# Patient Record
Sex: Female | Born: 1935
Health system: Southern US, Community
[De-identification: ages and names within clinical notes are randomized; demographics above are authoritative.]

## PROBLEM LIST (undated history)

## (undated) DIAGNOSIS — I341 Nonrheumatic mitral (valve) prolapse: Secondary | ICD-10-CM

## (undated) DIAGNOSIS — R600 Localized edema: Secondary | ICD-10-CM

## (undated) DIAGNOSIS — I10 Essential (primary) hypertension: Secondary | ICD-10-CM

## (undated) DIAGNOSIS — G43909 Migraine, unspecified, not intractable, without status migrainosus: Secondary | ICD-10-CM

## (undated) DIAGNOSIS — E785 Hyperlipidemia, unspecified: Secondary | ICD-10-CM

## (undated) DIAGNOSIS — I4819 Other persistent atrial fibrillation: Secondary | ICD-10-CM

## (undated) DIAGNOSIS — I340 Nonrheumatic mitral (valve) insufficiency: Secondary | ICD-10-CM

## (undated) DIAGNOSIS — Z952 Presence of prosthetic heart valve: Secondary | ICD-10-CM

## (undated) DIAGNOSIS — M8580 Other specified disorders of bone density and structure, unspecified site: Secondary | ICD-10-CM

## (undated) HISTORY — DX: Presence of prosthetic heart valve: Z95.2

## (undated) HISTORY — DX: Nonrheumatic mitral (valve) prolapse: I34.1

## (undated) HISTORY — PX: BLADDER SUSPENSION: SHX72

## (undated) HISTORY — DX: Nonrheumatic mitral (valve) insufficiency: I34.0

## (undated) HISTORY — DX: Other persistent atrial fibrillation: I48.19

## (undated) HISTORY — DX: Hyperlipidemia, unspecified: E78.5

## (undated) HISTORY — DX: Localized edema: R60.0

## (undated) HISTORY — DX: Migraine, unspecified, not intractable, without status migrainosus: G43.909

## (undated) HISTORY — PX: MITRAL VALVE REPLACEMENT: SHX147

## (undated) HISTORY — DX: Essential (primary) hypertension: I10

## (undated) HISTORY — DX: Other specified disorders of bone density and structure, unspecified site: M85.80

---

## 1976-02-01 HISTORY — PX: ABDOMINAL HYSTERECTOMY: SHX81

## 2005-02-22 ENCOUNTER — Inpatient Hospital Stay (HOSPITAL_COMMUNITY): Admission: RE | Admit: 2005-02-22 | Discharge: 2005-02-25 | Payer: Self-pay | Admitting: Urology

## 2005-02-23 ENCOUNTER — Ambulatory Visit: Payer: Self-pay | Admitting: Pulmonary Disease

## 2008-06-23 ENCOUNTER — Ambulatory Visit (HOSPITAL_COMMUNITY): Admission: RE | Admit: 2008-06-23 | Discharge: 2008-06-23 | Payer: Self-pay | Admitting: Interventional Cardiology

## 2008-06-23 ENCOUNTER — Encounter (INDEPENDENT_AMBULATORY_CARE_PROVIDER_SITE_OTHER): Payer: Self-pay | Admitting: Interventional Cardiology

## 2008-07-07 ENCOUNTER — Ambulatory Visit: Payer: Self-pay | Admitting: Thoracic Surgery (Cardiothoracic Vascular Surgery)

## 2008-07-30 ENCOUNTER — Inpatient Hospital Stay (HOSPITAL_BASED_OUTPATIENT_CLINIC_OR_DEPARTMENT_OTHER): Admission: RE | Admit: 2008-07-30 | Discharge: 2008-07-30 | Payer: Self-pay | Admitting: Interventional Cardiology

## 2008-08-11 ENCOUNTER — Ambulatory Visit: Payer: Self-pay | Admitting: Thoracic Surgery (Cardiothoracic Vascular Surgery)

## 2008-09-22 ENCOUNTER — Ambulatory Visit (HOSPITAL_COMMUNITY)
Admission: RE | Admit: 2008-09-22 | Discharge: 2008-09-22 | Payer: Self-pay | Admitting: Thoracic Surgery (Cardiothoracic Vascular Surgery)

## 2008-09-22 ENCOUNTER — Ambulatory Visit: Payer: Self-pay | Admitting: Thoracic Surgery (Cardiothoracic Vascular Surgery)

## 2008-09-22 ENCOUNTER — Encounter: Payer: Self-pay | Admitting: Thoracic Surgery (Cardiothoracic Vascular Surgery)

## 2008-09-24 ENCOUNTER — Ambulatory Visit: Payer: Self-pay | Admitting: Thoracic Surgery (Cardiothoracic Vascular Surgery)

## 2008-09-24 ENCOUNTER — Inpatient Hospital Stay (HOSPITAL_COMMUNITY)
Admission: RE | Admit: 2008-09-24 | Discharge: 2008-10-01 | Payer: Self-pay | Admitting: Thoracic Surgery (Cardiothoracic Vascular Surgery)

## 2008-09-24 ENCOUNTER — Encounter: Payer: Self-pay | Admitting: Thoracic Surgery (Cardiothoracic Vascular Surgery)

## 2008-09-24 HISTORY — PX: COX-MAZE MICROWAVE ABLATION: SHX1404

## 2008-10-07 ENCOUNTER — Ambulatory Visit: Payer: Self-pay | Admitting: Thoracic Surgery (Cardiothoracic Vascular Surgery)

## 2008-10-07 ENCOUNTER — Encounter
Admission: RE | Admit: 2008-10-07 | Discharge: 2008-10-07 | Payer: Self-pay | Admitting: Thoracic Surgery (Cardiothoracic Vascular Surgery)

## 2008-11-03 ENCOUNTER — Encounter
Admission: RE | Admit: 2008-11-03 | Discharge: 2008-11-03 | Payer: Self-pay | Admitting: Thoracic Surgery (Cardiothoracic Vascular Surgery)

## 2008-11-03 ENCOUNTER — Ambulatory Visit: Payer: Self-pay | Admitting: Thoracic Surgery (Cardiothoracic Vascular Surgery)

## 2009-01-12 ENCOUNTER — Ambulatory Visit: Payer: Self-pay | Admitting: Thoracic Surgery (Cardiothoracic Vascular Surgery)

## 2009-05-11 ENCOUNTER — Ambulatory Visit: Payer: Self-pay | Admitting: Thoracic Surgery (Cardiothoracic Vascular Surgery)

## 2009-05-11 ENCOUNTER — Encounter
Admission: RE | Admit: 2009-05-11 | Discharge: 2009-05-11 | Payer: Self-pay | Admitting: Thoracic Surgery (Cardiothoracic Vascular Surgery)

## 2009-09-21 ENCOUNTER — Ambulatory Visit: Payer: Self-pay | Admitting: Thoracic Surgery (Cardiothoracic Vascular Surgery)

## 2010-02-12 ENCOUNTER — Encounter
Admission: RE | Admit: 2010-02-12 | Discharge: 2010-02-12 | Payer: Self-pay | Source: Home / Self Care | Attending: Psychiatry | Admitting: Psychiatry

## 2010-05-04 ENCOUNTER — Other Ambulatory Visit: Payer: Self-pay | Admitting: Gastroenterology

## 2010-05-07 LAB — BASIC METABOLIC PANEL
CO2: 26 mEq/L (ref 19–32)
Calcium: 8.1 mg/dL — ABNORMAL LOW (ref 8.4–10.5)

## 2010-05-07 LAB — PROTIME-INR
INR: 1.5 (ref 0.00–1.49)
Prothrombin Time: 17.6 seconds — ABNORMAL HIGH (ref 11.6–15.2)

## 2010-05-08 LAB — TYPE AND SCREEN: Antibody Screen: NEGATIVE

## 2010-05-08 LAB — POCT I-STAT 3, ART BLOOD GAS (G3+)
Acid-Base Excess: 2 mmol/L (ref 0.0–2.0)
Acid-base deficit: 3 mmol/L — ABNORMAL HIGH (ref 0.0–2.0)
Acid-base deficit: 6 mmol/L — ABNORMAL HIGH (ref 0.0–2.0)
Bicarbonate: 21.3 mEq/L (ref 20.0–24.0)
O2 Saturation: 100 %
O2 Saturation: 89 %
O2 Saturation: 96 %
O2 Saturation: 97 %
Patient temperature: 35.3
Patient temperature: 97.9
TCO2: 22 mmol/L (ref 0–100)
pCO2 arterial: 32.6 mmHg — ABNORMAL LOW (ref 35.0–45.0)
pH, Arterial: 7.43 — ABNORMAL HIGH (ref 7.350–7.400)
pO2, Arterial: 350 mmHg — ABNORMAL HIGH (ref 80.0–100.0)

## 2010-05-08 LAB — URINALYSIS, ROUTINE W REFLEX MICROSCOPIC
Bilirubin Urine: NEGATIVE
Glucose, UA: NEGATIVE mg/dL
Protein, ur: NEGATIVE mg/dL
Specific Gravity, Urine: 1.012 (ref 1.005–1.030)
pH: 8 (ref 5.0–8.0)

## 2010-05-08 LAB — BASIC METABOLIC PANEL
BUN: 14 mg/dL (ref 6–23)
BUN: 9 mg/dL (ref 6–23)
CO2: 25 mEq/L (ref 19–32)
Calcium: 7.8 mg/dL — ABNORMAL LOW (ref 8.4–10.5)
Calcium: 7.9 mg/dL — ABNORMAL LOW (ref 8.4–10.5)
Calcium: 7.9 mg/dL — ABNORMAL LOW (ref 8.4–10.5)
Chloride: 103 mEq/L (ref 96–112)
Chloride: 108 mEq/L (ref 96–112)
Chloride: 108 mEq/L (ref 96–112)
Creatinine, Ser: 0.63 mg/dL (ref 0.4–1.2)
Creatinine, Ser: 0.65 mg/dL (ref 0.4–1.2)
Creatinine, Ser: 0.67 mg/dL (ref 0.4–1.2)
Creatinine, Ser: 0.68 mg/dL (ref 0.4–1.2)
GFR calc Af Amer: >60 mL/min (ref >60)
GFR calc Af Amer: >60 mL/min (ref >60)
GFR calc Af Amer: >60 mL/min (ref >60)
GFR calc Af Amer: >60 mL/min (ref >60)
GFR calc non Af Amer: >60 mL/min (ref >60)
GFR calc non Af Amer: >60 mL/min (ref >60)
GFR calc non Af Amer: >60 mL/min (ref >60)
Glucose, Bld: 155 mg/dL — ABNORMAL HIGH (ref 70–99)
Glucose, Bld: 95 mg/dL (ref 70–99)
Potassium: 3 mEq/L — ABNORMAL LOW (ref 3.5–5.1)
Sodium: 135 mEq/L (ref 135–145)

## 2010-05-08 LAB — PROTIME-INR
INR: 1.2 (ref 0.00–1.49)
INR: 1.3 (ref 0.00–1.49)
INR: 1.4 (ref 0.00–1.49)
INR: 1.7 — ABNORMAL HIGH (ref 0.00–1.49)
Prothrombin Time: 15.3 seconds — ABNORMAL HIGH (ref 11.6–15.2)
Prothrombin Time: 15.8 seconds — ABNORMAL HIGH (ref 11.6–15.2)
Prothrombin Time: 17.2 seconds — ABNORMAL HIGH (ref 11.6–15.2)
Prothrombin Time: 19.7 seconds — ABNORMAL HIGH (ref 11.6–15.2)

## 2010-05-08 LAB — CBC
HCT: 30.7 % — ABNORMAL LOW (ref 36.0–46.0)
HCT: 37.5 % (ref 36.0–46.0)
Hemoglobin: 12.9 g/dL (ref 12.0–15.0)
Hemoglobin: 9.4 g/dL — ABNORMAL LOW (ref 12.0–15.0)
Hemoglobin: 9.8 g/dL — ABNORMAL LOW (ref 12.0–15.0)
MCHC: 33.8 g/dL (ref 30.0–36.0)
MCHC: 34 g/dL (ref 30.0–36.0)
MCHC: 34.1 g/dL (ref 30.0–36.0)
MCHC: 34.5 g/dL (ref 30.0–36.0)
MCHC: 35.6 g/dL (ref 30.0–36.0)
MCV: 92.5 fL (ref 78.0–100.0)
MCV: 92.8 fL (ref 78.0–100.0)
MCV: 95.4 fL (ref 78.0–100.0)
Platelets: 101 10*3/uL — ABNORMAL LOW (ref 150–400)
Platelets: 108 10*3/uL — ABNORMAL LOW (ref 150–400)
Platelets: 111 10*3/uL — ABNORMAL LOW (ref 150–400)
Platelets: 133 10*3/uL — ABNORMAL LOW (ref 150–400)
RBC: 2.75 MIL/uL — ABNORMAL LOW (ref 3.87–5.11)
RBC: 2.99 MIL/uL — ABNORMAL LOW (ref 3.87–5.11)
RBC: 3.02 MIL/uL — ABNORMAL LOW (ref 3.87–5.11)
RBC: 3.08 MIL/uL — ABNORMAL LOW (ref 3.87–5.11)
RBC: 3.69 MIL/uL — ABNORMAL LOW (ref 3.87–5.11)
RBC: 4.04 MIL/uL (ref 3.87–5.11)
RDW: 14.9 % (ref 11.5–15.5)
RDW: 15.5 % (ref 11.5–15.5)
WBC: 10 10*3/uL (ref 4.0–10.5)
WBC: 5 10*3/uL (ref 4.0–10.5)
WBC: 8.6 10*3/uL (ref 4.0–10.5)
WBC: 8.9 10*3/uL (ref 4.0–10.5)

## 2010-05-08 LAB — POCT I-STAT 4, (NA,K, GLUC, HGB,HCT)
Glucose, Bld: 103 mg/dL — ABNORMAL HIGH (ref 70–99)
Glucose, Bld: 207 mg/dL — ABNORMAL HIGH (ref 70–99)
Glucose, Bld: 90 mg/dL (ref 70–99)
HCT: 23 % — ABNORMAL LOW (ref 36.0–46.0)
HCT: 23 % — ABNORMAL LOW (ref 36.0–46.0)
Hemoglobin: 10.9 g/dL — ABNORMAL LOW (ref 12.0–15.0)
Potassium: 3.4 mEq/L — ABNORMAL LOW (ref 3.5–5.1)
Potassium: 3.5 mEq/L (ref 3.5–5.1)
Potassium: 3.9 mEq/L (ref 3.5–5.1)
Sodium: 135 mEq/L (ref 135–145)
Sodium: 139 mEq/L (ref 135–145)
Sodium: 141 mEq/L (ref 135–145)

## 2010-05-08 LAB — HEMOGLOBIN AND HEMATOCRIT, BLOOD: HCT: 22.4 % — ABNORMAL LOW (ref 36.0–46.0)

## 2010-05-08 LAB — PREPARE FRESH FROZEN PLASMA

## 2010-05-08 LAB — GLUCOSE, CAPILLARY
Glucose-Capillary: 100 mg/dL — ABNORMAL HIGH (ref 70–99)
Glucose-Capillary: 112 mg/dL — ABNORMAL HIGH (ref 70–99)
Glucose-Capillary: 114 mg/dL — ABNORMAL HIGH (ref 70–99)
Glucose-Capillary: 118 mg/dL — ABNORMAL HIGH (ref 70–99)
Glucose-Capillary: 132 mg/dL — ABNORMAL HIGH (ref 70–99)
Glucose-Capillary: 144 mg/dL — ABNORMAL HIGH (ref 70–99)
Glucose-Capillary: 150 mg/dL — ABNORMAL HIGH (ref 70–99)

## 2010-05-08 LAB — BLOOD GAS, ARTERIAL
FIO2: 0.21 %
TCO2: 26.6 mmol/L (ref 0–100)
pH, Arterial: 7.436 — ABNORMAL HIGH (ref 7.350–7.400)

## 2010-05-08 LAB — URINE MICROSCOPIC-ADD ON

## 2010-05-08 LAB — POCT I-STAT, CHEM 8
BUN: 8 mg/dL (ref 6–23)
Calcium, Ion: 0.85 mmol/L — ABNORMAL LOW (ref 1.12–1.32)
Chloride: 106 mEq/L (ref 96–112)
Creatinine, Ser: 0.5 mg/dL (ref 0.4–1.2)
Creatinine, Ser: 0.7 mg/dL (ref 0.4–1.2)
HCT: 28 % — ABNORMAL LOW (ref 36.0–46.0)
Hemoglobin: 9.5 g/dL — ABNORMAL LOW (ref 12.0–15.0)
Potassium: 4 mEq/L (ref 3.5–5.1)
Sodium: 141 mEq/L (ref 135–145)
Sodium: 144 mEq/L (ref 135–145)

## 2010-05-08 LAB — MAGNESIUM: Magnesium: 2.5 mg/dL (ref 1.5–2.5)

## 2010-05-08 LAB — POCT I-STAT GLUCOSE: Glucose, Bld: 94 mg/dL (ref 70–99)

## 2010-05-08 LAB — PLATELET COUNT: Platelets: 91 10*3/uL — ABNORMAL LOW (ref 150–400)

## 2010-05-08 LAB — APTT: aPTT: 23 seconds — ABNORMAL LOW (ref 24–37)

## 2010-05-08 LAB — CREATININE, SERUM
Creatinine, Ser: 0.69 mg/dL (ref 0.4–1.2)
GFR calc Af Amer: >60 mL/min (ref >60)
GFR calc non Af Amer: >60 mL/min (ref >60)

## 2010-05-08 LAB — COMPREHENSIVE METABOLIC PANEL
BUN: 10 mg/dL (ref 6–23)
CO2: 27 mEq/L (ref 19–32)
Calcium: 9.6 mg/dL (ref 8.4–10.5)
GFR calc Af Amer: >60 mL/min (ref >60)
GFR calc non Af Amer: >60 mL/min (ref >60)
Glucose, Bld: 88 mg/dL (ref 70–99)
Sodium: 137 mEq/L (ref 135–145)
Total Bilirubin: 1 mg/dL (ref 0.3–1.2)
Total Protein: 6.8 g/dL (ref 6.0–8.3)

## 2010-05-08 LAB — PREPARE PLATELETS

## 2010-05-08 LAB — MRSA PCR SCREENING: MRSA by PCR: NEGATIVE

## 2010-05-08 LAB — ABO/RH: ABO/RH(D): O NEG

## 2010-05-10 LAB — POCT I-STAT 3, ART BLOOD GAS (G3+)
Acid-Base Excess: 2 mmol/L (ref 0.0–2.0)
O2 Saturation: 95 %

## 2010-05-10 LAB — POCT I-STAT 3, VENOUS BLOOD GAS (G3P V)
O2 Saturation: 68 %
pCO2, Ven: 46.1 mmHg (ref 45.0–50.0)
pH, Ven: 7.375 — ABNORMAL HIGH (ref 7.250–7.300)
pO2, Ven: 36 mmHg (ref 30.0–45.0)

## 2010-06-15 NOTE — Assessment & Plan Note (Signed)
OFFICE VISIT   [name], Tracy A  DOB:  Jul 17, 1935                                        November 03, 2008  CHART #:  [id number]   The patient returns to the office today for routine followup status post  right miniature thoracotomy for mitral valve replacement and Cox-  CryoMaze procedure on September 24, 2008.  Her postoperative recovery has  been uneventful.  She was last seen here in the office on October 07, 2008, at which time she was doing well.  Since then, she has been seen  in followup by Dr. Eldridge Dace.  Her dose of amiodarone was cut back at the  time of her last visit with Dr. Eldridge Dace to 200 mg daily.  She has  remained in sinus rhythm.  Overall, the patient reports that she is  doing quite well.  She is walking up to a mile at a time without  difficulty or shortness of breath.  She has not started the cardiac  rehab program.  She has minimal residual soreness.  She has not had any  shortness of breath.  Her appetite is good.  She has no other  complaints.   PHYSICAL EXAMINATION:  Notable for well-appearing female.  Blood  pressure 119/78, pulse 78 and regular.  Two-channel telemetry rhythm  strip demonstrates normal sinus rhythm.  Examination of the chest  reveals a well-healed anterolateral thoracotomy incision.  Auscultation  reveals clear breath sounds which are symmetrical bilaterally.  No  wheezes or rhonchi are noted.  Cardiovascular exam is notable for  regular rate and rhythm.  No murmurs, rubs, or gallops are appreciated.  The abdomen is soft, nontender.  The extremities are warm and well  perfused.  There is no lower extremity edema.  The right groin incision  has healed nicely.  The remainder of her physical exam is unrevealing.   DIAGNOSTIC TESTS:  Chest x-ray performed today at the [facility] is reviewed.  This demonstrates clear lung fields bilaterally  with trivial residual right pleural effusion.  No other  abnormalities  are noted.  There is slight residual atelectasis opacity involving the  right middle lobe and right lower lobe which is improved in comparison  with last x-ray.   IMPRESSION:  Satisfactory progress following recent mitral valve  replacement and maze procedure.  The patient is maintaining sinus rhythm  and functionally doing quite well.   PLAN:  I have encouraged the patient to continue to increase her  physical activity as tolerated without any particular physical  limitations at this point in time.  I have suggested that she try the  outpatient cardiac rehab program.  She has asked about Coumadin, and I  would suggest that she should stay on Coumadin for a full 3 months after  her recent surgery.  If her rhythm remains sinus after she comes off  amiodarone, we could potentially stop Coumadin after 3 months.  All of  her questions have been addressed.  We have not made any changes in her  current medications.  We will plan to see her back in 2 months.   Salvatore Decent. Cornelius Moras, M.D.  Electronically Signed   CHO/MEDQ  D:  11/03/2008  T:  11/04/2008  Job:  [id number]   cc:   Dario Guardian, M.D.  Corky Crafts,  MD

## 2010-06-15 NOTE — H&P (Signed)
HISTORY AND PHYSICAL EXAMINATION   July 07, 2008   Re:  Tracy Tracy [name], Tracy Tracy [name]         DOB:  01-14-1936   REQUESTING PHYSICIAN:  Tracy Crafts, MD   PRIMARY CARE PHYSICIAN:  Tracy Guardian, MD   REASON FOR CONSULTATION:  Severe mitral regurgitation and persistent  atrial fibrillation.   HISTORY OF PRESENT ILLNESS:  The patient is Tracy [name] 75 year old female from  [place] with longstanding history of heart murmur presumed related to  rheumatic heart disease.  The patient states that she was first told  that she had Tracy [name] heart murmur at age 26.  She has never had any related  cardiac problems until recently.  She has been treated for hypertension  and hyperlipidemia.  She reports that over the last 4-6 months, she has  developed worsening symptoms of exertional shortness of breath with  occasional dizzy spells.  She returned to see Dr. Katrinka [name], her routine  primary care physician in April.  She was noted to be in atrial  fibrillation.  She was referred to Dr. Eldridge [name] for formal cardiac  evaluation.  Tracy [name] 2-D echocardiogram was performed on Jun 10, 2008.  This  revealed normal left ventricular size and function.  Ejection fraction  was estimated at 50-55%.  There was moderate thickening of the mitral  valve leaflets with mild mitral stenosis and moderate-to-severe mitral  regurgitation.  Anatomical findings of restricted mitral valve were  suggestive of underlying rheumatic heart disease.  There was mild  tricuspid regurgitation.  There was severe left atrial enlargement with  moderate right atrial enlargement.  The aortic valve appeared normal  with no aortic regurgitation.  No other abnormalities were noted.  The  patient was anticoagulated with Coumadin and given metoprolol for rate  control.  She was brought in for elective transesophageal echocardiogram  and DC cardioversion on Jun 23, 2008.  Transesophageal echocardiogram  confirmed the presence of severe mitral  regurgitation with anatomical  findings of the mitral valve suggestive of underlying rheumatic mitral  valve disease.  There was no sign of left atrial thrombus and DC  cardioversion was performed converting the patient to Tracy [name] sinus rhythm.  She has now been referred to consider elective surgical treatment.  The  patient reports that she is feeling better since her cardioversion with  improved exercise tolerance and only mild residual exertional shortness  of breath.  She has not had any further dizzy spells.  She has never had  any chest discomfort.   REVIEW OF SYSTEMS:  GENERAL:  The patient reports normal appetite.  She  has not been gaining nor losing weight recently.  CARDIAC:  Notable for the 4- to 6- month history of progressive  exertional shortness of breath and occasional dizzy spells, prompting Tracy [name]  recent evaluation.  She has mild chronic bilateral lower extremity  edema.  She denies any PND, orthopnea, or syncopal episodes.  She has  never had chest pain.  RESPIRATORY:  Notable for mild dry cough that seems to start when she  was started on metoprolol, although this cough has improved.  She denies  any productive cough, hemoptysis, or wheezing.  GASTROINTESTINAL:  Negative.  The patient has no difficulty swallowing.  She denies symptoms of reflux.  Denies hematochezia, hematemesis, or  melena.  Bowel function is regular.  GENITOURINARY:  Negative.  PERIPHERAL VASCULAR:  Negative.  The patient denies symptoms suggestive  of claudication.  NEUROLOGIC:  Notable for longstanding history of migraine headaches.  The patient still gets approximately 4 migraine headaches per month.  These are usually promptly relieved with administration of Imitrex.  MUSCULOSKELETAL:  Negative.  The patient has mild arthritis afflicting  the fingers of her hands.  This does not seem to bother her much.  PSYCHIATRIC:  Negative.  HEENT:  Negative.  The patient reports good dentition and sees her   dentist on Tracy [name] regular basis.  She is given oral antibiotic prophylaxis  with all dental work.   PAST MEDICAL HISTORY:  1. Rheumatic mitral regurgitation.  2. Hypertension.  3. Hyperlipidemia  4. Persistent atrial fibrillation, status post DC cardioversion.   PAST SURGICAL HISTORY:  1. Bladder resuspension for cystocele.  2. Breast augmentation.   FAMILY HISTORY:  Noncontributory.   SOCIAL HISTORY:  The patient is married and lives with her husband in  [place].  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  She still remains fairly active and enjoys  gardening and golfing and traveling with her husband.  She has Tracy [name]  previous history of tobacco use, although she quit smoking in 1998.  She  denies excessive alcohol consumption.   CURRENT MEDICATIONS:  1. Imitrex 100 mg as needed.  2. Aspirin 81 mg daily.  3. Keppra 500 mg 2-1/2 tablets daily.  4. Multivitamin.  5. Vitamin D and calcium supplement.  6. Glucosamine Chondroitin Complex.  7. Magnesium.  8. Melatonin.  9. Triamterene/hydrochlorothiazide 75/50 one tablet daily.  10.Furosemide 20 mg daily.  11.Evista 60 mg daily.  12.Coumadin 5 mg daily except 7.5 mg every Thursday and Sunday.  13.Toprol-XL 25 mg daily.   DRUG ALLERGIES:  NEOSPORIN OINTMENT causes topical rash.   PHYSICAL EXAMINATION:  General:  The patient is Tracy [name] well-appearing female  who appears her stated age in no acute distress.  Vital Signs:  Blood  pressure 109/67, pulse is 68 and regular, oxygen saturation 93% on room  air.  HEENT:  Unrevealing.  Neck:  Supple.  There is no jugular venous  distention.  There are no carotid bruits.  Chest:  Auscultation of the  chest demonstrates clear breath sounds, which are symmetrical  bilaterally.  No wheezes or rhonchi demonstrated.  Cardiovascular:  Notable for regular rate and rhythm.  There is Tracy [name] grade 3/6 systolic  murmur heard best at the apex with radiation to the axilla.  No   diastolic murmurs noted.  Abdomen:  Soft, nondistended, and nontender.  The liver edge is nonpalpable.  Bowel sounds are present.  Extremities:  Warm and well perfused.  Femoral pulses are slightly diminished, but  palpable in both groins.  Distal pulses are thready, but palpable in the  posterior tibial position.  There is no lower extremity edema.  There  are chronic skin changes in both lower legs suggestive of some venous  insufficiency and poor microcirculation.  There are no open skin lesions  or ulcerations.  Rectal and GU:  Both deferred.  Neurologic:  Grossly  nonfocal and symmetrical throughout.   DIAGNOSTIC TESTS:  Transesophageal echocardiogram performed at the time  of DC cardioversion on Jun 23, 2008, is reviewed.  This demonstrates  severe mitral regurgitation with anatomical findings of the mitral valve  consistent with rheumatic mitral valve disease.  Specifically, there is  restriction of both the anterior and the posterior leaflet of the mitral  valve during both systole and diastole.  There is thickening of both  leaflets with some bowing of the anterior leaflet.  However, the  anterior leaflet is mobile and there is very mild mitral stenosis if  any.  The jet of mitral regurgitation is brought in central.  Left  ventricular size and function is normal.  Left atrium was enlarged.  The  aortic valve appears normal and there is no aortic insufficiency.  There  is mild tricuspid regurgitation.   IMPRESSION:  Rheumatic mitral valve disease with severe mitral  regurgitation, progressive symptoms of exertional shortness of breath,  and relatively recent development of persistent atrial fibrillation  requiring DC cardioversion, now maintaining sinus rhythm.  I believe  that the patient would best be treated with elective mitral valve repair  or replacement and concomitant Maze procedure.  She will need cardiac  catheterization to rule out the presence of significant coronary  artery  disease.  At the time of catheterization, we would request angiography  of the descending thoracic and abdominal aorta and iliac vessels to rule  out significant peripheral vascular disease.  In the absence of  significant coronary artery disease, the patient might be Tracy [name] good  candidate for minimally invasive approach.  There is Tracy [name] chance that her  mitral valve might be repairable, although I would expect that there is  Tracy [name] better than 50% likelihood, her valve will need to be replaced.   PLAN:  I have discussed the options at length with the patient and her  husband here in the office today.  The relative risks and benefits of  proceeding with surgery in the near future has been discussed versus  continued close observation with conservative management.  We have also  discussed surgical alternatives assuming that her valve may not be  repairable.  We discussed the relative risks and benefits of use of Tracy [name]  mechanical prosthesis with need for long-term anticoagulation and  associated risk of thromboembolism or bleeding.  We also discussed the  relative risks of use of Tracy [name] Bioprosthetic tissue valve with the potential  for late structural valve deterioration and failure depending upon  longevity.  We discussed the relative risks and benefits of concomitant  Maze procedure and all their questions have been addressed.  The patient  seems eager to proceed with surgery in the reasonably near future.  She  has just been cardioverted within the last 2 weeks, and she will need to  remain on Coumadin for the time being.  We will plan to see her back  once her heart catheterization has been completed.  At some point, I  think it would be reasonable to go ahead and start amiodarone as well  prior to surgery as this will decrease the likelihood of perioperative  arrhythmias and improve her chances for maintaining sinus rhythm down  the road.   Salvatore Decent. Cornelius Moras, M.D.  Electronically Signed    CHO/MEDQ  D:  07/07/2008  T:  07/08/2008  Job:  [id number]   cc:   Tracy Tracy [name], M.D.  Tracy Crafts, MD

## 2010-06-15 NOTE — Assessment & Plan Note (Signed)
OFFICE VISIT   [name], Tracy A  DOB:  01-25-1936                                        September 21, 2009  CHART #:  [id number]   HISTORY OF PRESENT ILLNESS:  The patient is status post right miniature  thoracotomy for mitral valve replacement using a 25-mm [employer]onic Mosaic  porcine bioprosthetic valve and Cox CryoMaze procedure done by Dr. Cornelius Moras  on September 24, 2008.  The patient is nearly a year postoperatively.  She  was last seen in the office in April.  At that time, the patient had  some swelling in her right lower extremity and a duplex ultrasound was  ordered of the right lower extremity to rule out DVT.  This duplex  ultrasound was negative and no DVT noted.  The patient presents back  today for follow up visit.  She was without complaints.  Right lower  extremity still noted some swelling.  She states she elevates her legs  and wears her hose throughout the day.  She states it is not worse than  it was in April but not significantly better.  He is ambulating 2 miles  per day.  She is golfing and working on her garden.  The patient feels  that she is progressing extremely well.  She plans to follow up with Dr.  Eldridge Dace next month.   PHYSICAL EXAMINATION:  Vital Signs:  Blood pressure of 117/78, pulse 79,  respirations of 18, O2 sats 97% on room air.  Respiratory:  Clear to  auscultation bilaterally.  Cardiac:  Regular rate and rhythm.  No murmur  noted.  Abdomen:  Bowel sounds x4.  Soft, nontender.  Extremities:  Positive right lower extremity edema.  Positive pitting right lower  extremity.  No edema noted on the left lower extremity.  The patient has  2+ bilateral DP and PT pulses noted bilaterally.  Incisions:  All  incisions are healed well.   IMPRESSION:  The patient continues to progress well.  She is maintaining  in normal sinus rhythm.   PLAN:  We will plan to continue the patient on current medications.  She  was seen and evaluated by Dr.  Cornelius Moras today.  Dr. Cornelius Moras plans to follow up  with the patient in 1 year for rhythm check.  The patient will plan to  continue following up with Dr. Eldridge Dace.   Salvatore Decent. Cornelius Moras, M.D.  Electronically Signed   KMD/MEDQ  D:  09/21/2009  T:  09/22/2009  Job:  [id number]   cc:   Corky Crafts, MD  Dario Guardian, M.D.

## 2010-06-15 NOTE — Op Note (Signed)
NAME:  Tracy [name], Tracy [name]               ACCOUNT NO.:  [id number]   MEDICAL RECORD NO.:  [id number]          PATIENT TYPE:  OUT   LOCATION:  VASC                         FACILITY:  [facility]   PHYSICIAN:  Salvatore Decent. Cornelius Moras, M.D. DATE OF BIRTH:  24-Feb-1935   DATE OF PROCEDURE:  09/24/2008  DATE OF DISCHARGE:                               OPERATIVE REPORT   PREOPERATIVE DIAGNOSES:  1. Severe mitral regurgitation.  2. Persistent atrial fibrillation.   POSTOPERATIVE DIAGNOSES:  1. Severe mitral regurgitation.  2. Persistent atrial fibrillation.   PROCEDURE:  Right miniature thoracotomy for mitral valve replacement (25-  mm Medtronic Mosaic porcine bioprosthesis) and Cox CryoMaze procedure.   SURGEON:  Salvatore Decent. Cornelius Moras, MD   ASSISTANT:  Evelene Croon, MD   SECOND ASSISTANT:  Sol Blazing, PA   ANESTHESIA:  General.   BRIEF CLINICAL NOTE:  The patient is a 75 year old female with  longstanding history of rheumatic heart disease and heart murmur.  The  patient presents with a 4 to 42-month history of worsening symptoms of  exertional shortness of breath and occasional dizzy spells.  She was  found to be in atrial fibrillation.  A 2D echocardiogram revealed normal  left ventricular size and function with severe mitral regurgitation and  mild mitral stenosis.  Transesophageal echocardiogram confirmed findings  consistent with rheumatic heart disease with severe mitral  regurgitation.  Left and right heart catheterization demonstrates normal  coronary artery anatomy with no significant coronary artery disease.  A  full consultation has been dictated previously.  The patient and her  husband have been counseled at length regarding the indications, risks,  and potential benefits of surgery.  Alternative treatment strategies  have been discussed.  The patient understands and accepts all potential  associated risks of surgery and desires to proceed as described.  She  understands there is a  high likelihood that her valve would not be  repairable and that valve replacement will be necessary.  Under the  circumstances, she specifically requests that we replace her valve using  a bioprosthetic tissue valve.  She understands that this will come with  the potential for late structural valve deterioration failure depending  upon longevity.   OPERATIVE FINDINGS:  1. Rheumatic mitral regurgitation with mild mitral stenosis.  2. Normal left ventricular systolic function.  3. Severe biatrial enlargement.   OPERATIVE NOTE IN DETAIL:  The patient was brought to the operating room  on the above-mentioned date and central monitoring was established by  the Anesthesia Team under the care and direction of Dr. Sharee Holster.  Specifically, a Swan-Ganz catheter was placed through the left internal  jugular approach.  A radial arterial line was placed.  Intravenous  antibiotics were administered.  The patient is placed in the supine  position on the operating table.  General endotracheal anesthesia was  induced uneventfully.  Initially, the patient is intubated with a dual-  lumen endotracheal tube.  A Foley catheter was placed.  Baseline  transesophageal echocardiogram was performed by Dr. Jacklynn Bue.  This  confirms findings consistent with underlying rheumatic heart disease  with severe  mitral regurgitation and mild mitral stenosis.  The aortic  valve appears normal and there is no aortic insufficiency.  The mitral  valve leaflets were thickened.  The posterior leaflet of mitral valve  was severely thickened and restricted and essentially immobile.  There  is mild tricuspid regurgitation.  There is severe biatrial enlargement.  There is normal left ventricular size and function.   The patient was positioned with a soft roll behind the right scapula and  the neck gently extended and turned towards the left.  The patient's  right neck, chest, abdomen, both groins, and both lower  extremities were  prepared and draped in sterile manner.  A small incision was made in the  right inguinal crease.  The anterior surface of the right common femoral  artery and right common femoral vein were dissected through the small  incision.  The common femoral vein was localized at the level of the  greater saphenous bulb.  The femoral artery is soft and normal in  appearance.   Single lung ventilation was begun.  A right miniature anterolateral  thoracotomy incision was performed.  The right pleural space was entered  through the fourth intercostal space.  The incision was placed somewhat  higher than usual in order to avoid the patient's breast implant.  The  pectoralis major muscle was retracted anteriorly and preserved entirely.  Upon entry into the right pleural space, one can appreciate that the  gross appearance of the lung has findings consistent with chronic  obstructive pulmonary disease and the lung does not deflate easily  despite the fact that it is not getting ventilated.  Two 11-mm  thoracoscopic ports were placed through separate stab incisions  inferiorly.  The right pleural space was insufflated with carbon dioxide  gas continuously throughout the remainder of the operation through one  of the ports.  A soft tissue retractor was placed.  Once the lung was  adequately out of the way, a longitudinal incision was made in the  pericardium 2 cm anterior to the phrenic nerve.   The patient is heparinized systemically.  A pursestring suture was  placed on the anterior surface of the greater saphenous bulb.  Too small  concentric pursestring sutures were placed on the anterior surface of  the right common femoral artery.  The common femoral vein was cannulated  through the saphenous bulb and a long flexible guidewire was advanced up  through the inferior vena cava through the right atrium into the  superior vena cava using transesophageal echocardiogram for guidance.   The femoral vein was dilated with serial dilators and a 22-French long  femoral venous cannula was advanced over the guidewire up through the  inferior vena cava through the right atrium into the superior vena cava.  The right common femoral artery was cannulated with a Seldinger  technique and a long flexible guidewire was advanced up into the  descending thoracic aorta.  Its position was visualized within the lumen  of the aorta using transesophageal echocardiogram.  The femoral artery  was dilated with serial dilators and an 18-French femoral arterial  cannula was advanced over the guidewire into the distal iliac artery.  The right internal jugular vein was cannulated with Seldinger technique.  However, after numerous attempts, flexible guidewire could not be  advanced through the internal jugular vein into the right atrium.  Subsequent placement of the internal jugular cannula was then aborted.   Cardiopulmonary bypass was begun.  Vacuum assist venous drainage was  utilized.  The pericardial incision was extended in both directions and  pericardial stay sutures were placed to retract the pericardium to  facilitate exposure.  A pledgeted stitch was placed in the dome of the  right hemidiaphragm and retracted inferiorly to facilitate exposure.  There was incomplete venous drainage with a single venous cannula.  A 14-  [place] pediatric femoral venous cannula was then placed into the  superior vena cava directly through the surgical incision to achieve  bicaval venous drainage.  At this juncture, venous drainage was  excellent.   The patient was placed in gentle Trendelenburg position and systemic  cooling was begun.  The patient was cooled to 24 degrees centigrade  temperature until ventricular fibrillation ensues.  The mitral valve  operation and maze procedure were completed under cold fibrillatory  arrest.  Left atrium was opened posteriorly through the interatrial  groove and the  incision was completed partway across the back wall of  the left atrium after opening the oblique sinus inferiorly.  A retractor  blade was placed into the left atrium and attached to a separate side  arm placed through a small stab incision just to the right side of the  sternum through the third intercostal space.  Exposure is felt to be  excellent.   The mitral valve was carefully examined.  The mitral valve leaflets are  severely thickened.  The entire posterior leaflet was severely thickened  and restricted with heavy calcification in the annulus.  The subvalvular  apparatus is severely thickened and foreshortened and all primary chords  were severely thickened and foreshortened consistent with longstanding  rheumatic mitral valve disease.  All of the primary chords to the  anterior leaflet were involved as well and the anterior leaflet was  severely thickened.  An attempted valve repair was felt to be  unfeasible.   The Cox CryoMaze procedure was now performed using the ATS Cryothermy  probe.  Each lesion was performed with a 2-minute freeze.  Initially, a  lesion was placed across the epicardial surface of the back wall of the  left atrium from the inferior apex of the atriotomy incision onto the  coronary sinus to reach the posterior rim of the mitral annulus  posteriorly.  A mirror image lesion was placed along the endocardial  surface of the left atrium extending on to the posterior mitral annulus  at its midportion.  An elliptical lesion was then created around the  pulmonary veins.  Initially, the first half of this lesion was created  across the back wall of the left atrium from the inferior apex of the  atriotomy incision to reach the inferior rim of the left-sided pulmonary  veins.  Lesion was then created from the cephalad apex of the atriotomy  incision across the dome of the atrium to reach the posterior rim of the  left-sided pulmonary veins.  This completely  encircles the pulmonary  veins and isolate them from below.  This completed the left side lesion  set of Cox CryoMaze procedure.   Mitral valve replacement was now performed.  The entire anterior leaflet  was excised sharply.  The primary chords to the anterior leaflet were  all severely involved with rheumatic disease and therefore none of the  course of the anterior leaflet were preserved.  However, the entire  posterior leaflet was preserved after splitting it in the midline to  facilitate valve implantation.  The mitral annulus was sized to accept a  25-mm stented bioprosthetic  tissue valve.  Mitral valve replacement was  performed using interrupted 2-0 Ethibond horizontal mattress pledgeted  sutures with pledgets in the supra-annular position.  The Medtronic  Mosaic porcine bioprosthetic tissue valve (serial number [id number],  size 25 mm) was secured in place uneventfully.  After this valve  replacement was completed, the valve was tested by instilling saline  into the left ventricular chamber.  The valve leaflets closed normally.  All the sutures appeared to be secured and there was no sign of  perivalvular leak.   The left ventricular vent was placed across the mitral valve and the  left atriotomy incision was closed posteriorly using a two-layer closure  of running 3-0 Prolene suture.  The right side lesion set of Cox  CryoMaze procedure was now performed again using a 2-minute freeze.  Initially, a lesion was placed along the lateral wall of the right  atrium reaching from the lateral border of the superior vena cava along  the lateral wall of right atrium to reach the lateral border of the  inferior vena cava.  Another oblique lesion was then placed standing  onto the acute margin of the heart.   The patient was rewarmed to 37 degrees centigrade temperature.  Spontaneous rhythm resumes.  Epicardial pacing wire was fixed  undersurface the right ventricular free wall into the  right atrium.  The  lungs were ventilated and heart allowed to fill briefly after which time  the left ventricular vent was removed.  The patient was weaned from  cardiopulmonary bypass.  However, at initial separation from bypass, the  patient developed ST-segment elevation across the inferior pericardial  leads.  Transesophageal echocardiogram demonstrated inferior wall  akinesis and there was hypotension with left ventricular dysfunction,  all consistent with intracoronary air.  Transesophageal echocardiogram  also demonstrated a few air bubbles in the left ventricular outflow  tract and in the left ventricle itself.  Cardiopulmonary bypass was  continued.  The heart was allowed to fill while keeping the patient in  Trendelenburg position.  Low-dose dopamine infusion was begun.  Ultimately, the patient subsequently weaned from cardiopulmonary bypass  without difficulty.  The patient's rhythm at separation from bypass was  normal in sinus rhythm.  The patient was weaned from bypass on dopamine  at 3 mcg/kg per minute.  Total cardiopulmonary bypass time of the  operation was 204 minutes.   Followup transesophageal echocardiogram following separation from bypass  demonstrates resumption of normal left ventricular function with no  significant wall motion abnormalities.  There is a well seated  bioprosthetic tissue valve in the mitral position that is functioning  normally.  There is no residual mitral regurgitation.  There is no sign  of any perivalvular leak.  At this juncture, there does not appear to be  any residual air.  No other significant abnormalities were noted.  There  remains mild tricuspid regurgitation.   Protamine was administered to reverse the anticoagulation.  The SVC  cannula was removed and its cannulation site secured.  The femoral  venous cannula was removed and the pursestring sutures were secured.  The femoral arterial cannula was removed and pursestring sutures  were  secured.  There is a palpable pulse in the distal right femoral artery.   Single lung ventilation was begun.  However, the lung did not collapse  very well and the patient did not tolerate single lung ventilation for  very long due to development of hypoxemia on one-lung ventilation.  During the brief period of  single-lung ventilation, the left atriotomy incision was inspected for hemostasis.  The pericardial space was  drained with a 28-French Bard drain exited through the anterior port  incision.  No attempt was made to close pericardium.  Dual lung  ventilation was resumed.   The right groin incision was irrigated with saline solution, inspected  for hemostasis, and subsequently closed in layers.  The skin incision  was closed with subcuticular skin closure.  Single-lung ventilation was  resumed again, and the right pleural space was inspected for hemostasis.  The right pleural space was drained with a 28-French Bard drain exited  through the posterior port incision.  The thoracotomy incision was  closed in multiple layers and the skin incision closed with subcuticular  skin closure.  Dual-lumen ventilation was resumed after completion of  the thoracotomy closure.   Chest tubes were fixed to close suction drainage device.  The patient  tolerated the procedure well.  The patient was reintubated with a single-  lumen endotracheal tube and subsequently transported to surgical  intensive care unit in stable condition.  There were no intraoperative  complications.  All sponge, instrument, and needle counts were verified  correct at completion of the operation.      Salvatore Decent. Cornelius Moras, M.D.  Electronically Signed     CHO/MEDQ  D:  09/24/2008  T:  09/25/2008  Job:  [id number]   cc:   Corky Crafts, MD  Dario Guardian, M.D.

## 2010-06-15 NOTE — Assessment & Plan Note (Signed)
OFFICE VISIT   [name], Tracy A  DOB:  [birth year]                                        May 11, 2009  CHART #:  [id number]   HISTORY OF PRESENT ILLNESS:  The patient to the office today for follow  up status post right miniature thoracotomy for mitral valve replacement  and Cox CryoMaze procedure in August 2010.  She was last seen here in  the office on January 12, 2009.  Since then, she has continued to do  quite well.  She has been followed up by Dr. Eldridge Dace and now she has  been taken off both amiodarone and Coumadin.  Her exercise tolerance has  continued to improve nicely, and in fact she reports that she feels  completely different than she did last spring and summer prior to her  surgery.  She is walking extended distances and back to the gym and  doing some working out.  She is not having any shortness of breath.  She  is not having any tachy palpitations or dizzy spells.  She does still  have some occasional mild twinges of soreness in the right lateral chest  wall that seem to be localized to where her old chest tubes were at the  time of her surgery.  She states that she noticed this when she started  doing some heavy weightlifting at the jam.  She has backed off on how  much upper arm exercises she has done and this has essentially resolved.  She does note that she still has significant swelling in her right lower  extremity and associated the right leg feels sort of heavy and numb  feeling.  She denies any particular numbness in any nerve root  distribution, but rather the whole leg itself just feels sort of  slightly deadened.  She states that the swelling seems to go down at  night, but during the day when she is up and on her feet it gradually  gets worse as the day goes on.  She otherwise feels quite well and has  no complaints.  The remainder of her review of systems is unremarkable  and the remainder of her past medical history is  unchanged.   CURRENT MEDICATIONS:  Aspirin, Keppra, melatonin, Lasix, Evista,  Singulair, Toprol-XL, Tylenol, oxycodone, Lipitor, potassium supplement.   PHYSICAL EXAMINATION:  Notable for well-appearing female with blood  pressure 130/81, pulse 75 and regular.  Two-channel telemetry rhythm  strip demonstrates what appears to be normal sinus rhythm.  The rhythm  is clearly regular.  The T-wave amplitude is somewhat small and there is  some interference in her lead, but I believe this is sinus rhythm.  Oxygen saturation is 95% on room air.  Auscultation of the chest reveals  clear breath sounds which are symmetrical bilaterally.  No wheezes,  rales, or rhonchi are noted.  The mini thoracotomy incision has healed  nicely.  Cardiovascular exam is notable for regular rate and rhythm.  No  murmurs, rubs, or gallops are noted.  The abdomen is soft, nontender.  The right groin incision has healed nicely.  There is moderate right  lower extremity edema extending all the way up to and slightly above the  right knee.  There is no swelling on the left side.  Pulses are  palpable.  Rectal and GU  exams are both deferred.   IMPRESSION:  The patient appears to be doing very well and seems to be  maintaining sinus rhythm.  From a functional standpoint.  She feels  completely different and much better than she did prior to her surgery.  She does have significant residual edema involving her right lower  extremity.  She is now off both amiodarone and Coumadin.   PLAN:  We will send the patient for lower extremity duplex scan to rule  out deep venous thrombosis.  Assuming this is not the case, I have  counseled her that she may have some venous insufficiency on that side,  potentially related to venous cannulation used for her surgery.  If  there is no sign of deep venous thrombosis, then there is probably no  role for anticoagulation.  She should work to try to keep her leg  propped up as much as  possible when she is not up and ambulating.  If  the duplex scan reveals findings suggestive of deep venous thrombosis,  she will need to go back on Coumadin therapy.  All of their questions  have been addressed.  We will see her back in 4 months' time.   Salvatore Decent. Cornelius Moras, M.D.  Electronically Signed   CHO/MEDQ  D:  05/11/2009  T:  05/12/2009  Job:  [id number]   cc:   Corky Crafts, MD  Dario Guardian, M.D.

## 2010-06-15 NOTE — Op Note (Signed)
NAME:  Tracy [name], Tracy [name]               ACCOUNT NO.:  [id number]   MEDICAL RECORD NO.:  [id number]          PATIENT TYPE:  AMB   LOCATION:  ENDO                         FACILITY:  [facility]   PHYSICIAN:  Corky Crafts, MDDATE OF BIRTH:  01/13/1936   DATE OF PROCEDURE:  06/23/2008  DATE OF DISCHARGE:  06/23/2008                               OPERATIVE REPORT   REFERRING PHYSICIAN:  Dario Guardian, MD   PROCEDURES PERFORMED:  TEE cardioversion.   INDICATIONS:  Atrial fibrillation, mitral regurgitation.   OPERATOR:  Corky Crafts, MD   PROCEDURE NARRATIVE:  Transesophageal echocardiogram was performed and  showed no left atrial thrombus.  Defibrillator pads were placed on the  anterior chest wall and back.  A single 120-joule biphasic shock was  administered with restoration of normal sinus rhythm.  There was no  apparent complications.   RECOMMENDATIONS:  Continue Coumadin for at least 30 days.  We will refer  to cardiac surgery for possible mitral valve replacement/repair.   ANESTHESIA:  Dr. Ivin Booty was present and 90 mg of Diprivan was given for  the cardioversion.      Corky Crafts, MD  Electronically Signed     JSV/MEDQ  D:  06/23/2008  T:  06/24/2008  Job:  [id number]

## 2010-06-15 NOTE — Assessment & Plan Note (Signed)
OFFICE VISIT   [name], Tracy A  DOB:  1935-04-27                                        August 11, 2008  CHART #:  [id number]   The patient returns to the office today for further followup of severe  mitral regurgitation with persistent atrial fibrillation.  She was  originally seen in consultation on July 07, 2008, and a full  consultation, and history and physical exam were dictated at that time.  Since then, she underwent left and right heart catheterization by Dr.  Eldridge Dace on July 30, 2008.  Findings were notable for the presence of  normal coronary artery anatomy with no significant coronary artery  disease.  There was severe mitral regurgitation.  Pulmonary artery  pressures were mildly elevated at 39/15 with pulmonary capillary wedge  pressure of 18.  Abdominal aortogram revealed no significant  atherosclerotic disease involving the descending abdominal aorta or  iliac vessels.   I again reviewed matters at length with the patient and her husband here  in the office today.  Functional anatomy of the mitral valve is  consistent with rheumatic mitral regurgitation.  I suspect that there is  somewhat less than 50% chance that her valve will be repairable, but  depending upon intraoperative findings, it is still conceivable that  valve repair can be attempted.  Based upon findings from her recent  catheterization, I feel she is reasonably a candidate for use of minimal  invasive approach for surgery.  She has remained clinically stable and  feeling well since her cardioversion was performed, and she has had no  further worsening of symptoms of congestive heart failure.   The patient desires to wait until late August to proceed with surgery as  she plans to attend a family gathering in [place], the second week of  August.  I think this is reasonable as long as she remains clinically  stable.  As such we will tentatively plan for surgery on Wednesday,  September 24, 2008.  The patient will stop taking Coumadin in anticipation  of surgery on  September 17, 2008, and I will see her in the office for followup on  Monday, September 22, 2008.  All of her questions have been addressed.   Salvatore Decent. Cornelius Moras, M.D.  Electronically Signed   CHO/MEDQ  D:  08/11/2008  T:  08/12/2008  Job:  [id number]   cc:   Corky Crafts, MD  Dario Guardian, M.D.

## 2010-06-15 NOTE — Assessment & Plan Note (Signed)
OFFICE VISIT   [place], Tracy [name]  DOB:  1935-10-27                                        October 07, 2008  CHART #:  [id number]   HISTORY OF PRESENT ILLNESS:  The patient is status post right miniature  thoracotomy for mitral valve replacement using [name] 25-mm Medtronic Mosaic  porcine bioprosthesis as well as Cox cryo-maze procedure done by Dr.  Cornelius Moras on September 24, 2008.  The patient's postoperative course was pretty  much unremarkable.  She was able to convert to sinus rhythm following  the maze procedure and was discharged to home in sinus rhythm.  The  patient was discharged to home in stable condition.  She presents today  for 1-week followup visit.  The patient is up ambulating well without  difficulty.  She is tolerating diet well.  No nausea or vomiting noted.  The patient is sleeping most nights, still taking narcotic at night to  assist in sleeping, but at times does have difficulty finding [name]  comfortable position.  She does complain of [name] discomfort in her right  shoulder blade area that is improving.  The patient states over the  weekend she felt that she had an urinary tract infection and went to an  outpatient urgent care as this is known to be the holiday weekend.  She  was diagnosed with an UTI and started on Augmentin.  Since the patient  has started Augmentin, she states she is feeling better.  She was given  [name] prescription for [name] 10-day course.  The patient has been to Dr.  Hoyle Barr office for her PT/INR blood work.  It was drawn on Friday and  was noted to be around 1.7.  She has an appointment tomorrow to have [name]  repeat check.  The patient denies any opening or drainage from any of  her incision sites.  She denies any shortness of breath or significant  chest pain.   PHYSICAL EXAMINATION:  Vital Signs:  Blood pressure 102/65, pulse of 82,  respirations of 18, O2 sats 94% on room air.  Respiratory:  Clear to  auscultation bilaterally.   Cardiac:  Regular rate and rhythm.  No  murmurs noted.  Abdomen:  Benign.  Extremities:  Positive edema in right  lower extremity.  No edema noted in left lower extremity.  Incisions:  All incisions are clean, dry, and intact and healing well.  Two chest  tube sutures have been removed.   STUDIES:  The patient had PA and lateral chest x-ray done today which  shows improvement of the pleural effusions.  She still has [name] small right  pleural effusion noted.   IMPRESSION AND PLAN:  The patient is progressing quite well.  She is  told to continue ambulating 3-4 times per day and continue her breathing  exercises.  She is informed that cardiac rehab should be contacting her  in the next 2-3 weeks to set up for outpatient rehab.  She does have an  appointment with Dr. Eldridge Dace for next week and is told to keep that  appointment.  He will continue to manage her Coumadin.  At this time,  the patient is in normal sinus rhythm and we will continue to follow up.  She is told next week is okay for her to start driving and off  narcotics.  She is told still no heavy lifting over 10 pounds.  We will  plan to bring the patient back to see Dr. Cornelius Moras when he returns from [facility] with [name] repeat chest x-ray.  The patient is told in the interim if  she develops any surgical issues she is to contact us.  The patient is  instructed to continue with the full dose of the Augmentin.  The patient  is in agreement.   Salvatore Decent. Cornelius Moras, M.D.  Electronically Signed   KMD/MEDQ  D:  10/07/2008  T:  10/08/2008  Job:  [id number]   cc:   Dario Guardian, M.D.  Corky Crafts, MD

## 2010-06-15 NOTE — Cardiovascular Report (Signed)
NAME:  Tracy [name], Tracy [name]               ACCOUNT NO.:  [id number]   MEDICAL RECORD NO.:  [id number]          PATIENT TYPE:  OIB   LOCATION:  1961                         FACILITY:  [facility]   PHYSICIAN:  Corky Crafts, MDDATE OF BIRTH:  [birth year]   DATE OF PROCEDURE:  07/30/2008  DATE OF DISCHARGE:  07/30/2008                            CARDIAC CATHETERIZATION   REFERRING. PHYSICIAN:  Dario Guardian, MD   PROCEDURES PERFORMED:  1. Left and right heart catheterization.  2. Coronary angiogram.  3. Abdominal aortogram.  4. Left ventriculogram.   OPERATOR:  Corky Crafts, MD   INDICATIONS:  Preoperative evaluation for mitral valve repair.   PROCEDURE NARRATIVE:  The risks and benefits of cardiac catheterization  were explained to the patient and informed consent was obtained.  She  was brought to the cath lab.  She was prepped and draped in the usual  sterile fashion.  Her left groin was prepped and draped in the usual  sterile fashion.  A 4-French sheath was placed into the left femoral  artery using modified Seldinger technique.  A 7-French sheath was then  placed in the left femoral vein using modified Seldinger technique.  Both of the sheaths were placed after the area was infiltrated with  lidocaine.  A Swan-Ganz catheter was advanced to the pulmonary artery,  pressure tracings were recorded as well as saturation.  A pigtail  catheter was advanced through the arterial sheath to the left ventricle.  Simultaneous left ventricular and pulmonary artery pressures and  subsequent pulmonary capillary wedge pressures were obtained.  The Theone Murdoch catheter was then removed under continuous hemodynamic pressure  monitoring.  The pigtail catheter was used to perform a ventriculogram  in the RAO position.  Power injection of contrast was performed to image  the left ventricle.  The catheter was pulled back under continuous  hemodynamic pressure monitoring.  The catheter was  withdrawn to the  abdominal aorta and power injection of contrast was performed in the AP  projection.  The left coronary artery was engaged with a JL4 catheter  under fluoroscopic guidance.  Digital angiography was performed in  multiple projections using hand injection of contrast.  Right coronary  artery angiography was performed using a 3-D RC catheter.  Catheter was  placed in the vessel ostium under fluoroscopic guidance.  Digital  angiography was performed in a similar fashion.  The sheath will be  removed using manual compression.   FINDINGS:  Left ventriculogram shows low normal ventricular function  with an estimated EF of 50%.  There is 4+ mitral regurgitation and a  dilated left atrium.   Left main coronary artery is angiographically normal.   Left circumflex is a medium-sized vessel.  There is a large OM1, both of  these vessels appear angiographically normal.   The ramus vessel is a large vessel and appears angiographically normal.   Left anterior descending is a medium-sized vessel which reaches the apex  and appears angiographically normal.   The right coronary artery is a medium-sized dominant vessel and appears  angiographically normal.   The abdominal aortogram  shows no abdominal aortic aneurysm.  There are  bilateral single renal arteries, both of which are widely patent.   HEMODYNAMICS:  Right atrial pressure 11/10 with a mean right atrial  pressure of 9 mmHg.  RV pressure 46/10 with an RVEDP of 12 mmHg.  Pulmonary artery pressure 39/15 with a mean PA pressure of 25 mmHg.  Pulmonary capillary wedge pressure 19/20 with a mean pulmonary capillary  wedge pressure of 18 mmHg.  Left ventricular pressure 119/8 with an  LVEDP of 13 mmHg.  Aortic pressure 122/70 with a mean aortic pressure of  95 mmHg.  Femoral artery saturation is 95%.  PA saturation 68%.  By [facility]: Lambert Rhonda W  calculation, cardiac output of 5 L/min.  Cardiac index is 2.5.   IMPRESSION:  1. No significant  coronary artery disease.  2. Severe mitral regurgitation 4+ by angiography.  3. Upper normal pulmonary artery pressure.   RECOMMENDATIONS:  The patient we watched here for several hours and will  likely be discharged home.  She will follow up with Dr. Cornelius Moras with the  plan for mitral valve surgery.      Corky Crafts, MD  Electronically Signed     JSV/MEDQ  D:  07/30/2008  T:  07/30/2008  Job:  [id number]   cc:   Salvatore Decent. Cornelius Moras, M.D.

## 2010-06-15 NOTE — Op Note (Signed)
[place], LAPAGLIA               ACCOUNT NO.:  [id number]   MEDICAL RECORD NO.:  [id number]          PATIENT TYPE:  INP   LOCATION:  2303                         FACILITY:  [facility]   PHYSICIAN:  Burna Forts, M.D.DATE OF BIRTH:  [birth year]   DATE OF PROCEDURE:  09/24/2008  DATE OF DISCHARGE:                               OPERATIVE REPORT   The patient was brought to the OR in the morning of surgery where under  local anesthesia with sedation pulmonary artery and right arterial lines  are placed.  We have been asked to place TEE probe for evaluation of  cardiac structures and function in preparation for minimally invasive  mitral valve repair or replacement.  The patient was then taken to the  OR for routine induction of general anesthesia after which the trachea  is intubated following which the probe is then passed oropharyngeally  into the stomach and slightly withdrawn for imaging of the cardiac  structures.   PRE-CARDIOPULMONARY BYPASS EXAMINATION:  Left ventricle.  Left  ventricular chamber was initially seen in a short-axis view, normal size  and function, good overall contractile pattern is appreciated.  We have  also obtained long-axis views and again shows both good anterior and  posterior contractility appreciated.   Mitral valve.  Upon 4-chamber view at the mitral valve level, we see  that there is a thick anterior leaflet with both abnormality in size and  function appears to be restricted at the edges of the leaflets  particularly posterior leaflet is visualized.  It appears nearly  immobile and fixed.  There is the overall appearance in this 4-chamber  view of a mitral valve apparatus that had stenosis and some regurgitant  characteristics, continuously Doppler crosses reveals a jet of  approximately 1.7 m/s, this would be associated with about 12 to 13 mm  gradient across the valve.  The P1 half is calculated, P1 pressure half  time is calculated at 180, which  would correspond with active  calculations of the mitral valve area of about 1.2-1.3 in area.  Multiple views were obtained both commissural views and lateral views  which again revealed that there is primarily regurgitant jet centrally,  it is broad-based, goes well into the depth of the left atrial chamber,  this is to be consistent with moderate-to-severe mitral regurgitant  flow.   Left atrium.  Left atrial chamber is visualized in its entirety.  The  appendage was seen and is clear.  Overall left atrial chamber size is  increased.  There is mild bowing of the interatrial septum from left to  right, the interatrial septum is interrogated is intact, small jets were  seen which would indicate any patent areas.   Right ventricle was interrogated indeed, overall some mildly dilated in  size but overall contractility appears normal.   Tricuspid valve.  Tricuspid valve looks normal in just 1 or 2 views and  on color Doppler, there is only trace tricuspid regurgitant flow  appreciated.   Right atrium.  Normal right atrial chamber is appreciated that mildly  increased in size in the early chamber  view.   The patient is placed on cardiopulmonary bypass.  The decision was made  to replace the valve rather than to repair.  This is carried out to the  minimally invasive technique.  De-airing maneuvers are prepared and the  patient is ready for separation of cardiopulmonary bypass.   POST CARDIOPULMONARY TEE EXAMINATION:  In the early bypass.  Views of  the heart prior to complete separation showed there is some collection  of air bubbles just above the aortic valve.  This, we had to wait and  have the patient clear these first, this was done shortly.  Other de-  airing maneuvers were prepared.  The patient was then slowly separated  from cardiopulmonary bypass.   Left ventricle.  Left ventricular chamber seen in the early bypass.  Shows some dyskinesia noted in the septal area, and  significant  hypokinesia noted in the inferior wall laterally and anterior there was  good overall contractile pattern.  This was felt to be due to the air  that was seen in that early post bypass period.  With time and  separation with the addition of pressors, the contractile pattern in  both the inferior aspect, the anterior and anterolateral aspects as well  as the septum improved, however, TEE evaluations in the late bypass  period shows some residual dyskinesia of the septal wall area.   Mitral valve.  Multiple views were obtained both 4-chamber view, 2-  chamber views and with 3D this shows the mitral valve seated well in the  annular area.  The edges of the tissue leaflets could be seen with ease,  they open satisfactorily for inflow and close preferably during systolic  contraction.  Color Doppler around the valve in multiple views again  showed essentially no regurgitant or perivalvular jets appreciated,  appears to be seated well and functioning entirely in satisfactory  pattern.   The rest of the cardiac examination was as previously described in the  prebypass period without significant changes.   The patient is to return to the cardiac intensive care unit in stable  condition.           ______________________________  Burna Forts, M.D.     JTM/MEDQ  D:  09/24/2008  T:  09/25/2008  Job:  [id number]

## 2010-06-15 NOTE — Assessment & Plan Note (Signed)
OFFICE VISIT   Fesler, Vieno A  DOB:  [birth year]                                        May 11, 2009  CHART #:  [id number]   ADDENDUM.   Right lower extremity venous duplex scan performed today reveals no sign  of deep venous thrombosis.   Salvatore Decent. Cornelius Moras, M.D.  Electronically Signed   CHO/MEDQ  D:  05/11/2009  T:  05/12/2009  Job:  [id number]   cc:   Corky Crafts, MD  Dario Guardian, M.D.

## 2010-06-15 NOTE — Assessment & Plan Note (Signed)
OFFICE VISIT   [name], Tracy A  DOB:  [birth year]                                        January 12, 2009  CHART #:  [id number]   HISTORY OF PRESENT ILLNESS:  The patient returns to the office today for  routine follow up and rhythm check now more than 3-1/2 months status  post right miniature thoracotomy for mitral valve replacement and Cox  CryoMaze procedure.  She was last seen here in the office on November 03, 2008.  Since then, she has continued to do well.  She has not had any  shortness of breath, and in fact she states that her exercise tolerance  is notably better than it ever was prior to surgery.  She has not had  any pains in her chest except for the fact that she still has occasional  twinge of pain along the right lateral chest wall at the site of her  previous chest tubes.  She has not had any tachy palpitations or dizzy  spells.  She still has chronic bilateral lower extremity edema more so  on the right than the left.  This was present prior to surgery but may  be a little bit worse since the surgery.  She otherwise feels fine.  She  has not had any problems with Coumadin therapy.  She remains on  amiodarone 200 mg daily.  The remainder of her medications are  unchanged.   PHYSICAL EXAMINATION:  Notable for well-appearing female with blood  pressure 122/75, pulse 73 and regular.  Two-channel telemetry rhythm  strip demonstrates normal sinus rhythm.  Examination of the chest is  notable for well-healed miniature thoracotomy incisions.  Auscultation  reveals clear breath sounds bilaterally which are symmetrical.  No  wheezes, rales, or rhonchi are noted.  Cardiovascular exam includes  regular rate and rhythm.  No murmurs, rubs, or gallops are appreciated.  The abdomen is soft, nontender.  The extremities are warm and well  perfused.  There is mild bilateral lower extremity edema, more so on the  right than the left.   IMPRESSION:   Satisfactory progress now 3-1/2 months status post right  miniature thoracotomy for mitral valve replacement using bioprosthetic  tissue valve with concomitant Cox CryoMaze procedure.  The patient  appears to be maintaining sinus rhythm.  I think it would be reasonable  to consider stopping amiodarone at this time.  She is scheduled to see  Dr. Eldridge Dace for further follow up this week.   PLAN:  I have suggested to the patient that she discuss the possibility  of stopping amiodarone when she sees Dr. Eldridge Dace in the office this  week.  If her rhythm remains stable off amiodarone for a period of time,  she could eventually come off Coumadin.  All of her questions have been  addressed.  We will plan to see her back in 3 months for rhythm check.   Salvatore Decent. Cornelius Moras, M.D.  Electronically Signed   CHO/MEDQ  D:  01/12/2009  T:  01/13/2009  Job:  [id number]   cc:   Corky Crafts, MD  Dario Guardian, M.D.

## 2010-06-15 NOTE — Discharge Summary (Signed)
NAME:  Tracy [name], Tracy [name]               ACCOUNT NO.:  [id number]   MEDICAL RECORD NO.:  [id number]          PATIENT TYPE:  INP   LOCATION:  2003                         FACILITY:  [facility]   PHYSICIAN:  Salvatore Decent. Cornelius Moras, M.D. DATE OF BIRTH:  [birth year]   DATE OF ADMISSION:  09/24/2008  DATE OF DISCHARGE:  09/22/2008                               DISCHARGE SUMMARY   FINAL DIAGNOSES:  1. Severe mitral regurgitation.  2. Persistent atrial fibrillation.   IN-HOSPITAL DIAGNOSES:  1. Acute blood loss anemia postoperatively.  2. Volume overload postoperatively.  3. Thrombocytopenia postoperatively.   SECONDARY DIAGNOSES:  1. Hypertension.  2. Hyperlipidemia.  3. Status post bladder resuspension for cystocele.  4. Status post breast augmentation.   IN-HOSPITAL OPERATIONS AND PROCEDURES:  1. Intraoperative transesophageal echocardiogram.  2. Right miniature thoracotomy for mitral valve replacement using a 25-      mm Medtronic Mosaic porcine bioprosthesis.  3. Cox CryoMaze procedure.   PATIENT'S HISTORY AND PHYSICAL AND HOSPITAL COURSE:  The patient is a 75-  year-old female with a longstanding history of rheumatic heart disease  and heart murmur.  The patient presents with a 4-6 months history of  worsening symptoms of exertional shortness of breath and occasional  dizzy spells.  The patient was found to be in atrial fibrillation.  A 2-  D echocardiogram revealed normal left ventricular size and function with  severe mitral regurgitation and mild mitral stenosis.  Transesophageal  echocardiogram confirmed findings consistent with rheumatic heart  disease with severe mitral regurgitation.  Left and right heart  catheterization done demonstrates normal coronary artery anatomy with no  significant coronary artery disease.  The patient was referred to Dr.  Cornelius Moras.  Dr. Cornelius Moras saw and evaluated the patient.  He discussed this with  the patient undergoing a mini-mitral valve repair/replacement.   He  discussed also going CryoMaze procedure.  Risks and benefits were  discussed with the patient.  The patient voiced understanding and agreed  to proceed.  Surgery was scheduled for September 24, 2008.  For further  details of the patient's past medical history and physical exam, please  see dictated H and P.   The patient was taken to the operating room on September 24, 2008, where he  underwent right miniature thoracotomy for mitral valve replacement using  a 25-mm Medtronic Mosaic porcine bioprosthesis as well as Cox CryoMaze  procedure.  The patient tolerated this procedure well and was  transferred to the intensive care unit in stable condition.  Postoperatively, the patient was noted to be hemodynamically stable.  She was extubated on evening of surgery.  Post-extubation, the patient  noted to be alert and oriented x4, neuro intact.  The patient was in  normal sinus rhythm postoperatively.  Blood pressure was stable on low-  dose dopamine.  She was restarted on amiodarone.  Unfortunately, we were  unable to wean dopamine for several days.  Amiodarone was held as well  as beta-blocker.  She required a several-day ICU course secondary to her  hypotension.  She was finally able to be weaned off of dopamine by  postop day #5.  Blood pressure was stabilized.  She was restarted on her  amiodarone as well as low-dose beta-blocker.  Currently, she is  remaining in normal sinus rhythm and blood pressure stable.  We will  continue to follow closely prior to discharge.  Postoperatively post-  extubation, the patient was placed on nasal cannula.  Chest x-ray  obtained on postop day #1 was stable.  Chest tubes remained in place for  several days secondary to increased output.  Drainage had decreased, and  they were able to be discontinued by postop day #4.  Followup chest x-  ray remained stable.  The patient continued to use her incentive  spirometer and was able to be weaned off oxygen, sating  greater than 90%  on room air.  The patient was started on Coumadin postoperatively.  Daily PT/INR levels were obtained.  Currently, her most recent INR is  1.7.  External pacing wires were discontinued today without difficulty.  She did develop acute blood loss anemia postoperatively.  Her hemoglobin  and hematocrit were followed closely.  She did not require any blood  transfusions postoperatively.  Most recent hemoglobin and hematocrit was  9.4 and 26.3.  She did have some mild thrombocytopenia postoperatively  but was followed closely.  Over the postoperative course, it was  trending back up towards normal.  She was also noted to be volume  overload postoperatively.  Diuretics was placed on hold secondary to her  hypotension.  Once blood pressure was controlled, she was restarted on  diuretics.  She currently remains 5.7 kg above her preoperative weight.  Postoperatively, the patient was up ambulating well with cardiac rehab.  She was tolerating diet well.  No nausea, vomiting noted.  All incisions  were clean, dry, and intact and healing well.   On postop day #6, September 30, 2008, the patient was noted to be afebrile.  She is in normal sinus rhythm.  Blood pressure stable.  She is sating  greater than 98% on room air.  Her most recent lab work showed white  blood cell count 7.8, hemoglobin of 9.4, hematocrit of 26.3, platelet  count of 101.  Sodium of 133, potassium of 3.6, chloride of 103,  bicarbonate 25, BUN of 14, creatinine 0.67, glucose of 95.  INR of 1.7.  The patient is  discharged home on October 01, 2008.   FOLLOWUP APPOINTMENTS:  1. Followup appointment has been arranged with Dr. Orvan July PA on      October 07, 2008, at 1:15 p.m.  The patient will need to obtain PA      and lateral chest x-ray 30 minutes prior to this appointment.  2. The patient is to follow up with Dr. Eldridge Dace in 2 weeks.  She will      need to contact his office to schedule this appointment.  She is       also to obtain PT/INR, blood work at Dr. Hoyle Barr office on      October 03, 2008.  She will need to contact their office to make      these arrangements.   ACTIVITY:  The patient instructed no driving until released to do so, no  heavy lifting over 10 pounds.  She is told to ambulate 3-4 times per  day.  Progress as tolerated and continue her breathing exercises.   INCISIONAL CARE:  The patient is told to shower, washing her incisions  using soap and water.  She is to contact the office  if she develops any  drainage or opening from any of her incision sites.   DIET:  The patient educated on diet to be low fat, low salt.   DISCHARGE MEDICATIONS AT THE TIME OF DICTATION:  1. Amiodarone 200 mg b.i.d.  2. Lasix 40 mg b.i.d. x7 days then daily.  3. Oxycodone 5 mg 1-2 tablets q.4-6 h. p.r.n. pain.  4. Potassium chloride 20 mEq b.i.d. x7 days then daily.  5. Coumadin 2.5 mg at night.  6. Tylenol 500 mg q.6 h. p.r.n.  7. Aspirin 81 mg daily.  8. Calcium carbonate/vitamin D 2 tablets b.i.d.  9. Evista 60 mg daily.  10.Imitrex 1 spray nasally p.r.n.  11.Keppra 1000 mg at night.  12.Lipitor 10 mg daily.  13.Multivitamin daily.  14.Singulair 10 mg daily.  15.Toprol-XL 25 mg daily.      Sol Blazing, PA      Salvatore Decent. Cornelius Moras, M.D.  Electronically Signed    KMD/MEDQ  D:  09/30/2008  T:  10/01/2008  Job:  [id number]   cc:   Corky Crafts, MD  Dr. Williemae Natter

## 2010-06-15 NOTE — Assessment & Plan Note (Signed)
OFFICE VISIT   [name], Tracy A  DOB:  11-24-1935                                        September 22, 2008  CHART #:  [id number]   HISTORY OF PRESENT ILLNESS:  The patient returns to the office today for  further followup prior to elective surgery scheduled for Wednesday,  September 24, 2008.  She was originally seen in consultation on July 07, 2008, and a full history and physical exam was dictated at that time.  She was last seen here on August 11, 2008.  Since then, the patient has  remained entirely stable.  She has no new problems or complaints.   REVIEW OF SYSTEMS:  Entirely unchanged from previously.   MEDICATIONS:  She stopped taking Coumadin last week in anticipation of  surgery.  Her medications otherwise remain unchanged.   PAST MEDICAL HISTORY:  Unchanged.   PHYSICAL EXAMINATION:  Remains entirely stable.   PLAN:  I again reviewed the indications, risks, and potential benefits  of surgery with Tracy [name] her husband here in the office today.  She  understands that there is a high likelihood that her mitral valve will  need to be replaced due to severe scarring related to long-standing  rheumatic heart disease.  Under the circumstances, she specifically  requests that we would replace her valve using a bioprosthetic tissue  valve.  She understands that this will come with a small but significant  risk for late structural valve deterioration and failure depending upon  her longevity.  The alternative of use of a mechanical prosthesis was reviewed in detail  and all of her questions have been addressed.  We plan to proceed with  surgery tomorrow as previously discussed.   Salvatore Decent. Cornelius Moras, M.D.  Electronically Signed   CHO/MEDQ  D:  09/22/2008  T:  09/23/2008  Job:  [id number]   cc:   Corky Crafts, MD  Dario Guardian, M.D.

## 2010-06-18 NOTE — Op Note (Signed)
NAME:  Tracy [name], Tracy [name]               ACCOUNT NO.:  [id number]   MEDICAL RECORD NO.:  [id number]          PATIENT TYPE:  INP   LOCATION:  0007                         FACILITY:  [facility]   PHYSICIAN:  Martina Sinner, MD DATE OF BIRTH:  1935/09/06   DATE OF PROCEDURE:  02/22/2005  DATE OF DISCHARGE:                                 OPERATIVE REPORT   PREOPERATIVE DIAGNOSIS:  Cystocele, small rectocele.   POSTOPERATIVE DIAGNOSIS:  Cystocele, small rectocele.   SURGERY:  Cystocele repair utilizing dermal graft with paravaginal repair  plus cystoscopy.   SURGEON:  Dr. Lorin Picket MacDiarmid   ASSISTANT:  Dr. Glade Nurse   Tracy [name] had a large symptomatic cystocele that is primarily a  large central defect and mild paravaginal defect.  She had no stress  incontinence, even with the cystocele reduced.  She had minimal posterior  defect, though I  initially had planned to repair this.   Under general anesthesia, the patient was prepped and draped in the usual  fashion.  Extra care was taken to minimize the risk of compartment syndrome  neuropathy.  On exam under anesthesia, the apex was very well supported.  The dimples were easily identified.  Recognizing that she was not doing a  Valsalva maneuver, the posterior vaginal wall actually was quite flat.  At  the end of the case, I decided not to do a posterior repair since I did not  feel it was necessary.   Because her apex was well supported, it was difficult to put the Allis  clamps back near the apex.  I initially did T-shaped anterior vaginal wall  incision and mobilized the anterior vaginal wall from them pubocervical  fascia laterally towards the white line.  Between 2 Allis forceps, I then  mobilized the dome of the bladder cephalad from the anterior vaginal wall  and split it again in the midline to approximately 1 cm from the dimples.  I  then hypermobilized the entire vaginal wall laterally from the pubocervical  fascia  on both sides to the white line bilaterally.  There was some  bleeding, but this was taken care of with cautery.  I did oversew 1  cauterized vessel with 2 figure-of-eight 3-0 Vicryl sutures.  I was happy  that we did get back to the white line and were only approximately 1-2 cm  from the ischial spine, but it was not necessary to go back that far.  Two  other bleeders were oversewn with 0 Vicryl at 5 and 7 o'clock, and there was  excellent hemostasis.   The __________ device was used to anchor four 0 Vicryl sutures, 2 on each  side, deep in the levator muscles near the white line, 1 cephalad and 1  hugging near the pubic bone.  We then sized the graft to approximately 4.5 x  8 cm.  The dermal graft which was Zenoform, was sutured in place.  It was  tension free.  Hemostasis was excellent.  One centimeter of redundant  vaginal mucosa was excised on both sides.  Vaginal mucosa was closed with  running  2-0 Vicryl on a CT wand.   I then reinspected the vaginal wall.  Again, recognizing that she was not  doing a Valsalva maneuver, the apex and posterior vaginal actually looked  good.  There was no ring within the vagina.  There was no shortening.  I  thought it was in the patient's best interest not to do any posterior work.   Total blood loss was 200-300 mL.  Hemodynamically, she was fine.  A vaginal  pack was inserted.   I forgot to mention that before we sewed in the graft, we did a 1 layer  running imbrication repair with 2-0 Vicryl on an SH.  She was cystoscoped.  This was to reduce the cystocele so we could take care of some her central  defect and also work better laterally.  After the running anterior repair, I  cystoscoped the patient, and there was efflux of indigo carmine from both  ureteral orifices.   At the end of the case, the legs were in good position.  A vaginal pack was  inserted, and the Foley was draining blue urine.           ______________________________   Martina Sinner, MD  Electronically Signed     SAM/MEDQ  D:  02/22/2005  T:  02/22/2005  Job:  [id number]

## 2010-06-18 NOTE — Discharge Summary (Signed)
NAME:  Tracy [name], Tracy [name]               ACCOUNT NO.:  [id number]   MEDICAL RECORD NO.:  [id number]          PATIENT TYPE:  INP   LOCATION:  0164                         FACILITY:  [facility]   PHYSICIAN:  Martina Sinner, MD DATE OF BIRTH:  1936/01/21   DATE OF ADMISSION:  02/22/2005  DATE OF DISCHARGE:  02/25/2005                                 DISCHARGE SUMMARY   PREOPERATIVE DIAGNOSIS:  Cystocele.   POSTOPERATIVE DIAGNOSIS:  Cystocele.   ADMISSION DIAGNOSIS:  Hypotension of unknown etiology.   DISCHARGE DIAGNOSIS:  Hypotension of unknown etiology.   [name] had a cystocele repair that was uneventful.  Soon after in the  recovery room, she developed hypotension, was taken to the intensive care  unit because she needed pressors to maintain her pressure.  She had no  endorgan dysfunction during this entire episode.  She felt fine.  She had to  stay on pressors for approximately 20 hours.  She had a complete metabolic  work-up including work-up of her adrenal gland and cardiac status.  Her  cardiac enzymes were normal.  She had 1 Q wave that was new on lead 3, but  there was no cardiac evidence of a myocardial infarction.   Throughout her entire course, her blood pressure stabilized between a  systolic of 100-120, was as low as 70, requiring pressors immediately after  surgery.  The pressors were finally weaned off.  Her hemoglobin initially  postoperatively was 13.  It was 9.8 on the day she was discharged.   [name] was voiding well on the day of discharge.  I went over do's and  don'ts following a cystocele repair.  She will be followed up in clinic as  per protocol.           ______________________________  Martina Sinner, MD  Electronically Signed     SAM/MEDQ  D:  02/25/2005  T:  02/26/2005  Job:  [id number]

## 2010-06-18 NOTE — H&P (Signed)
NAME:  Tracy [name], Tracy [name]               ACCOUNT NO.:  [id number]   MEDICAL RECORD NO.:  [id number]          PATIENT TYPE:  INP   LOCATION:  0007                         [facility]ACILITY:  [facility]   PHYSICIAN:  Martina Sinner, MD DATE OF BIRTH:  19-Feb-1935   DATE OF ADMISSION:  02/22/2005  DATE OF DISCHARGE:                                HISTORY & PHYSICAL   ADMISSION DIAGNOSIS:  Hypotension following cystocele repair.   Ms. Tracy [name] is a 75 year old woman who had a cystocele repair on February 22, 2005.  Blood loss was approximately 200 ml.  Postoperative hemoglobin was  13.  There were no intraoperative problems with blood pressure.   In the recovery room, her blood pressure was approximately 70/20 and with a  Neo-Synephrine drip, it was 105/40.  She was put in Trendelenburg throughout  her entire postoperative course in the PACU.  She was alert and feeling  well.  At first, we felt that this was due to a medication effect.  Her  vaginal pack was even removed just in case she did have a rare reaction to  Estrace, recognizing that estrogen is not absorbed very well through the  vaginal mucosa.  Anesthesia did a wonderful job in maintaining her blood  pressure.   An EKG was finally performed, even though she had normal sinus rhythm of  approximately 59 on the single lead.  The EKG showed a Q wave in lead III,  and this was the only change from her preoperative EKG.  It was felt by  anesthesia, and I agreed with them, that the patient should go to the  intensive care unit and even possibly get cardiology involved.   Throughout the course, Mr. and Ms. Bogden were kept involved with the  decision-making process.   PAST MEDICAL HISTORY:  Mild hypotension.  She has had an echocardiogram  recently and has never had a myocardial infarction.  She has no history of  chronic lung disease.  She has symptomatic prolapse.  She has high  cholesterol.   CURRENT MEDICATIONS:  Hydrochlorothiazide, Lipitor.   Aspirin was held for  one week.  Atenolol, Topamax for headaches.  Evista,  Keppra.  Imitrex nasal  spray.   She has no allergies to medications.   PHYSICAL EXAMINATION:  VITAL SIGNS:  Blood pressure 104/39, pulse 72.  Normal sinus rhythm.  CONSTITUTIONAL/GENERAL APPEARANCE:  Comfortable and alert.  RESPIRATORY:  Normal breathing with no respiratory distress.  CHEST:  Clear.  HEART:  Heart sounds are normal.  ABDOMEN:  Soft.  GENITOURINARY:  Little to no vaginal bleeding.  NEUROMUSCULAR:  Moving extremities.   [name] was admitted to the intensive care unit for observation and for  supportive care.           ______________________________  Martina Sinner, MD  Electronically Signed     SAM/MEDQ  D:  02/22/2005  T:  02/23/2005  Job:  [id number]

## 2010-06-18 NOTE — Discharge Summary (Signed)
NAME:  Tracy [name], Tracy [name]               ACCOUNT NO.:  [id number]   MEDICAL RECORD NO.:  [id number]          PATIENT TYPE:  INP   LOCATION:  0164                         FACILITY:  [facility]   PHYSICIAN:  Martina Sinner, MD DATE OF BIRTH:  1935/08/24   DATE OF ADMISSION:  02/22/2005  DATE OF DISCHARGE:  02/25/2005                                 DISCHARGE SUMMARY   DISCHARGE DIAGNOSES:  1.  Cystocele.  2.  Unexplained postoperative hypotension, resolved.   PROCEDURE:  Anterior repair with mesh graft.   SURGEON:  Martina Sinner, MD   CONSULTATIONS:  ICU Critical Care Service.   HOSPITAL COURSE:  On February 22, 2005, the patient was taken to the  operating room where she underwent the aforementioned procedure. She  tolerated the procedure well without complications. Postoperatively in the  PACU, she began to experience hypotension. This was not symptomatic and  there was no end organ changes noted. The patient had excellent mentation  and she is making excellent urine output. The patient's hypotension was  investigated. Her hemoglobin, hematocrit, platelets and white count were  found to be normal. Electrolytes were also found to be normal as were  coagulation studies. Despite fluid resuscitation, the patient continued to  experience hypotension. It was thought possibly that the patient had a LATEX  allergy versus an ESTRACE allergy as her vagina was packed with gauze with  Estrace to aid in hemostasis. Because of this, her vaginal packing was  removed, this did not resolved the situation. At this point, a critical care  consult was obtained as the patient needed to be started on Neo to maintain  her MAP. The patient was transferred to the ICU under their care where she  received supportive care. She underwent a full investigation including  repeating the aforementioned labs, cortisol level. The patient gradually  over the first and second postoperative day was weaned off of her Neo  drop  and had stable blood pressures the remainder of her hospitalization. No  definitive diagnosis could be made, however, the working diagnosis  throughout the hospitalization was a reaction to her retained postoperative  anesthesia. On February 25, 2005, the patient was deemed to be in stable  condition to be discharged home as she had been stable vital signs off of  pressors for greater than 24 hours. On postoperative day #2, she had  underwent a removal of catheter and trial of void which she passed  successfully.   EXAMINATION AT DISCHARGE:  GENERAL:  This is a 75 year old Caucasian female  in no acute distress.  HEENT:  Atraumatic, normocephalic. Pupils equal round and reactive to light  and accommodation. Extraocular movements intact. Mucous membranes are moist  and intact.  NECK:  Supple, no cervical lymphadenopathy. No JVD.  CHEST:  Regular rate and rhythm without murmurs, rubs or gallops. Thyroid  clear to auscultation.  ABDOMEN:  Soft, nondistended, nontender to palpation. No costovertebral  angle tenderness.  EXTREMITIES:  Without edema, cyanosis or clubbing.  NEUROLOGIC:  Alert and oriented x3, no focal deficit.   DISPOSITION:  To home.   DISCHARGE  MEDICATIONS:  1.  Vicodin p.r.n.  2.  Ciprofloxacin 500 b.i.d. x4 days.   DISCHARGE INSTRUCTIONS:  The patient is instructed to call or return if she  experiences any fevers, chills, nausea, vomiting, redness or drainage from  her wound, dizziness, shortness of breath or changes in mental status. She  is instructed to avoid soaking her operative area for 10 days  postoperatively, however, she is instructed showers are fine on discharge.  She is also instructed to avoid driving while on narcotics, avoid heavy  lifting for next 8 weeks as well as avoid anything per vagina x6 weeks. She  is scheduled to followup with Martina Sinner, MD in approximately 2  weeks for a wound check.     ______________________________   Glade Nurse, MD      Martina Sinner, MD  Electronically Signed    MT/MEDQ  D:  02/25/2005  T:  02/27/2005  Job:  [id number]

## 2010-06-18 NOTE — Consult Note (Signed)
NAME:  Tracy [name], Tracy [name]               ACCOUNT NO.:  [id number]   MEDICAL RECORD NO.:  [id number]          PATIENT TYPE:  INP   LOCATION:  0164                         FACILITY:  [facility]   PHYSICIAN:  Danice Goltz, M.D. LHCDATE OF BIRTH:  08-06-1935   DATE OF CONSULTATION:  DATE OF DISCHARGE:                                   CONSULTATION   REASON FOR CONSULTATION:  Postoperative hypotension.   HISTORY OF PRESENT ILLNESS:  This is a pleasant 75 year old white female  status post repair of cystocele, per Dr. Sherron Monday.  Perioperative events  essentially uneventful.  Estimated blood loss at 350 ml.  She received  approximately 2700 ml of crystalloid perioperatively.  She returned to the  PACU at 1305 hours with systolic blood pressure ranging in the 60s.  Per  nursing staff, was alert and oriented.  She did complain of some bilateral  hand numbness at the time of admission.  She received additional 1300 ml of  crystalloid and was started on a Neo-Synephrine at 1330 hours.  She is  currently on 16.6 mcg/min of Neo-Synephrine, which has been weaned down from  a high of 40 mcg/min.  She denies chest pain, chest tightness, chest  pressure, shortness of breath, or any other pertinent problems.  She is  perfectly oriented and able to recall all of her home medication regimen.  We have been asked to evaluate her and assist with the treatment of her  postop hypertension.  Currently, she has a 12-lead EKG showing first-degree  heart block with initial cardiac enzymes all negative.  Postoperative  hemoglobin and hematocrit are 13.8 and 40, respectively.  She is currently  receiving an additional crystalloid __________.   PAST MEDICAL HISTORY:  1.  Hypertension.  2.  Hyperlipidemia.  3.  Nonspecific heart murmur.  4.  Migraines.  5.  Arthritis.  6.  Restless legs syndrome.   SOCIAL HISTORY:  She is a nonsmoker.  Denies ETOH.  She is a retired Cytogeneticist.  Denies baseline activity  limitations.   FAMILY HISTORY:  Mother well into 63s and in good health.  Denies coronary  artery disease or lung disease in the family.   ALLERGIES:  NEOSPORIN and MSG.   MEDICATIONS:  1.  Triam/HCTZ 75/50 1 half tab p.o. daily.  2.  Lipitor 10 mg daily.  3.  Aspirin 81 mg daily.  Stopped on February 14, 2005.  4.  Requip 1 mg one hour before bed daily.  5.  Atenolol 100 mg q.h.s.  6.  Topamax 50 mg p.o. q.h.s.  7.  Evista 60 mg p.o. q.a.m.  8.  Keppra 500 mg tablets 3 tabs p.o. q.h.s.  9.  Imitrex nasal spray p.r.n.  10. Omega III 1 gm daily.  11. Glucosamine 750 mg daily.  12. Vitamin D 400 mg daily.  13. Calcium 1200 mg daily.  14. MSM 750 mg daily.  15. Vitamin C 500 mg daily.   REVIEW OF SYSTEMS:  Per HPI.   PHYSICAL EXAMINATION:  VITAL SIGNS:  Temperature 96.9, heart rate 60s, sinus  rhythm with first-degree heart  block, blood pressure 107/42, respirations 12-  14, saturations 100% on 40% mask.  NECK:  No JVD or adenopathy.  RESPIRATORY:  Breath sounds are clear to auscultation.  CARDIAC:  Faint mitral murmur, 1/6.  She has a regular rate and rhythm with  first-degree heart block on 12-lead.:  ABDOMEN:  Soft and nontender with positive bowel sounds.  EXTREMITIES:  Without edema.  Capillary refill is brisk.  She has 2+ pulses.  GENITOURINARY:  Clear yellow urine.  Minimal bloody vaginal discharge after  surgery.  NEUROLOGIC:  Alert and oriented without focal motor deficits.   LABORATORY DATA:  Postoperative 12-lead, first-degree heart block, QRS  inversion in lead III.   Hemoglobin 13.8, hematocrit 40.  CK 92, MB 1.3.  Troponin I 0.04.   IMPRESSION/PLAN:  Postoperative hypotension, status post cystocele repair.  Differential diagnosis includes non-ST elevation myocardial infarction,  prolonged anesthetic effect or blood loss.  Suspect this is primarily post  anesthesia effect.  Repeat IV fluid bolus.  Hold antihypertensives.  Transfer to intensive care.  Follow up  cardiac enzymes.  Send CBC and lactic  acid.  Recheck portable chest x-ray.  Insert arterial line, titrate Neo-  Synephrine drip to keep MAP greater than 65.  Will consider central access  if hypotensive after current fluid challenge.      Anders Simmonds, N.P. [facility]    ______________________________  Danice Goltz, M.D. [facility]    PB/MEDQ  D:  02/22/2005  T:  02/23/2005  Job:  [id number]   cc:   Martina Sinner, MD  Fax: [phone]

## 2010-08-20 ENCOUNTER — Other Ambulatory Visit: Payer: Self-pay | Admitting: Family Medicine

## 2010-08-20 DIAGNOSIS — R599 Enlarged lymph nodes, unspecified: Secondary | ICD-10-CM

## 2010-08-25 ENCOUNTER — Ambulatory Visit
Admission: RE | Admit: 2010-08-25 | Discharge: 2010-08-25 | Disposition: A | Discharge status: Home / Self Care | Payer: Medicare Other | Source: Ambulatory Visit | Attending: Family Medicine | Admitting: Family Medicine

## 2010-08-25 DIAGNOSIS — R599 Enlarged lymph nodes, unspecified: Secondary | ICD-10-CM

## 2010-08-25 MED ORDER — IOHEXOL 300 MG/ML  SOLN
100.0000 mL | Freq: Once | INTRAMUSCULAR | Status: AC | PRN
  Administered 2010-08-25: 100 mL via INTRAVENOUS

## 2010-10-08 DIAGNOSIS — Z952 Presence of prosthetic heart valve: Secondary | ICD-10-CM | POA: Insufficient documentation

## 2010-10-08 DIAGNOSIS — I4819 Other persistent atrial fibrillation: Secondary | ICD-10-CM

## 2010-10-08 DIAGNOSIS — I10 Essential (primary) hypertension: Secondary | ICD-10-CM

## 2010-10-08 DIAGNOSIS — I340 Nonrheumatic mitral (valve) insufficiency: Secondary | ICD-10-CM | POA: Insufficient documentation

## 2010-10-08 DIAGNOSIS — E785 Hyperlipidemia, unspecified: Secondary | ICD-10-CM | POA: Insufficient documentation

## 2010-10-11 ENCOUNTER — Encounter: Payer: Self-pay | Admitting: Thoracic Surgery (Cardiothoracic Vascular Surgery)

## 2010-10-11 ENCOUNTER — Ambulatory Visit (INDEPENDENT_AMBULATORY_CARE_PROVIDER_SITE_OTHER): Payer: Medicare Other | Admitting: Thoracic Surgery (Cardiothoracic Vascular Surgery)

## 2010-10-11 VITALS — BP 126/76 | HR 76 | Resp 20 | Ht 69.0 in | Wt 165.0 lb

## 2010-10-11 DIAGNOSIS — Z954 Presence of other heart-valve replacement: Secondary | ICD-10-CM

## 2010-10-11 DIAGNOSIS — I059 Rheumatic mitral valve disease, unspecified: Secondary | ICD-10-CM

## 2010-10-11 NOTE — Progress Notes (Signed)
PCP is No primary provider on file. Referring Provider is Corky Crafts., *  Chief Complaint  Patient presents with  . Follow-up    1 year f/u S/P Mitral valve replacement and Cox Maze  09/22/10,    HPI: Patient returns for followup now more than 2 years status post mitral valve replacement and Maze procedure. She was last seen here in the office 09/21/2009. Since then she has remained clinically stable. She has continued to have intermittent problems with right lower extremity edema. She states that this was present prior to her surgery but it has certainly been worse since then. She apparently had fairly significant edema this past June and she underwent a duplex scan that again revealed no sign of any deep venous thrombosis. She has stable symptoms of mild exertional shortness of breath. She denies resting shortness of breath, PND, orthopnea, or lower extremity edema. She's not had palpitations no syncope. She has not had any chest pain.  Past Medical History  Diagnosis Date  . Hypertension   . Hyperlipidemia   . Mitral valve regurgitation   . Persistent atrial fibrillation   . S/P mitral valve replacement     09/24/2008    Past Surgical History  Procedure Date  . Mitral valve replacement 0825/2010    25mm Medtronic Mosaic bioprosthetic tissue valve  . Cox-maze microwave ablation 09/24/2008    complete biatrial lesion set    History reviewed. No pertinent family history.  Social History History  Substance Use Topics  . Smoking status: Former Smoker    Quit date: 02/01/1996  . Smokeless tobacco: Not on file  . Alcohol Use: Yes     1-2 drinks  weekly    Current Outpatient Prescriptions  Medication Sig Dispense Refill  . aspirin 81 MG tablet Take 81 mg by mouth daily.        Marland Kitchen atorvastatin (LIPITOR) 10 MG tablet Take 20 mg by mouth daily.       . calcium citrate-vitamin D 200-200 MG-UNIT TABS Take 1 tablet by mouth daily.        . furosemide (LASIX) 40 MG tablet  Take 40 mg by mouth.        . gabapentin (NEURONTIN) 600 MG tablet Take 600 mg by mouth 2 (two) times daily.        . metoprolol succinate (TOPROL-XL) 25 MG 24 hr tablet Take 25 mg by mouth daily.        . Multiple Vitamin (MULTIVITAMIN) tablet Take 1 tablet by mouth daily.        . potassium chloride SA (K-DUR,KLOR-CON) 20 MEQ tablet Take 20 mEq by mouth daily.        . raloxifene (EVISTA) 60 MG tablet Take 60 mg by mouth daily.        . SUMAtriptan Succinate (IMITREX PO) Take 100 tablets by mouth daily as needed.        . topiramate (TOPAMAX) 100 MG tablet Take 100 mg by mouth 2 (two) times daily.        Marland Kitchen trimethoprim (TRIMPEX) 100 MG tablet Take 100 mg by mouth 1 day or 1 dose.          Allergies  Allergen Reactions  . Macrobid Other (See Comments)    HA'S  . Septra (Bactrim) Swelling  . Neosporin (Triple Antibiotic) Rash    Review of Systems: Review of systems is notable for stable symptoms of mild exertional shortness of breath. She denies any worsening of her symptoms and she  specifically denies resting shortness of breath, PND, orthopnea, or lower extremity edema. She's not had chest pain. She has not had any palpitations.  BP 126/76  Pulse 76  Resp 20  Ht 5\' 9"  (1.753 m)  Wt 165 lb (74.844 kg)  BMI 24.37 kg/m2  SpO2 98% Physical Exam: Physical exam the patient looks quite good. 2 channel telemetry rhythm strip demonstrates normal sinus rhythm. The neck is supple. There is no cervical venous distention. Breath sounds are clear to auscultation and symmetrical bilaterally. Cardiovascular exam is notable for regular rate and rhythm. No murmurs rubs or gallops are noted. The abdomen is soft nontender. Extremities warm and well-perfused. There is mild right lower extremity edema.  Diagnostic Tests: Report of 2-D echocardiogram performed 09/28/2010 at a [facility] is reviewed. The patient has a normal functioning bioprosthetic tissue valve in the mitral position. There is  associated mild mitral regurgitation. The valve was functioning normally. There is normal left ventricular systolic function with ejection fraction estimated 60-65%. No other significant abnormalities are noted.  Impression: The patient is doing well now more than 2 years following mitral valve replacement with a bioprosthetic tissue valve and Maze procedure. She is maintaining sinus rhythm.  Plan: The patient will call and return to see [place] in the future as needed.

## 2010-10-12 HISTORY — PX: ENDOVENOUS ABLATION SAPHENOUS VEIN W/ LASER: SUR449

## 2011-03-09 DIAGNOSIS — Z1231 Encounter for screening mammogram for malignant neoplasm of breast: Secondary | ICD-10-CM | POA: Diagnosis not present

## 2011-03-09 DIAGNOSIS — Z8262 Family history of osteoporosis: Secondary | ICD-10-CM | POA: Diagnosis not present

## 2011-03-09 DIAGNOSIS — M899 Disorder of bone, unspecified: Secondary | ICD-10-CM | POA: Diagnosis not present

## 2011-03-09 DIAGNOSIS — M949 Disorder of cartilage, unspecified: Secondary | ICD-10-CM | POA: Diagnosis not present

## 2011-03-16 DIAGNOSIS — G43009 Migraine without aura, not intractable, without status migrainosus: Secondary | ICD-10-CM | POA: Diagnosis not present

## 2011-03-17 DIAGNOSIS — N390 Urinary tract infection, site not specified: Secondary | ICD-10-CM | POA: Diagnosis not present

## 2011-03-17 DIAGNOSIS — N8110 Cystocele, unspecified: Secondary | ICD-10-CM | POA: Diagnosis not present

## 2011-03-23 DIAGNOSIS — M949 Disorder of cartilage, unspecified: Secondary | ICD-10-CM | POA: Diagnosis not present

## 2011-03-23 DIAGNOSIS — E782 Mixed hyperlipidemia: Secondary | ICD-10-CM | POA: Diagnosis not present

## 2011-03-23 DIAGNOSIS — I10 Essential (primary) hypertension: Secondary | ICD-10-CM | POA: Diagnosis not present

## 2011-03-23 DIAGNOSIS — M899 Disorder of bone, unspecified: Secondary | ICD-10-CM | POA: Diagnosis not present

## 2011-07-07 DIAGNOSIS — H6120 Impacted cerumen, unspecified ear: Secondary | ICD-10-CM | POA: Diagnosis not present

## 2011-07-07 DIAGNOSIS — M899 Disorder of bone, unspecified: Secondary | ICD-10-CM | POA: Diagnosis not present

## 2011-07-07 DIAGNOSIS — Z23 Encounter for immunization: Secondary | ICD-10-CM | POA: Diagnosis not present

## 2011-07-07 DIAGNOSIS — R609 Edema, unspecified: Secondary | ICD-10-CM | POA: Diagnosis not present

## 2011-07-18 ENCOUNTER — Other Ambulatory Visit: Payer: Self-pay

## 2011-07-18 DIAGNOSIS — R230 Cyanosis: Secondary | ICD-10-CM

## 2011-07-18 DIAGNOSIS — M7989 Other specified soft tissue disorders: Secondary | ICD-10-CM

## 2011-08-18 ENCOUNTER — Encounter: Payer: Self-pay | Admitting: Vascular Surgery

## 2011-08-22 ENCOUNTER — Encounter: Payer: Self-pay | Admitting: Vascular Surgery

## 2011-08-23 ENCOUNTER — Ambulatory Visit (INDEPENDENT_AMBULATORY_CARE_PROVIDER_SITE_OTHER): Payer: Medicare Other | Admitting: Vascular Surgery

## 2011-08-23 ENCOUNTER — Encounter (INDEPENDENT_AMBULATORY_CARE_PROVIDER_SITE_OTHER): Payer: Medicare Other | Admitting: *Deleted

## 2011-08-23 ENCOUNTER — Encounter: Payer: Self-pay | Admitting: Vascular Surgery

## 2011-08-23 VITALS — BP 116/76 | HR 78 | Resp 18 | Ht 69.0 in | Wt 163.6 lb

## 2011-08-23 DIAGNOSIS — R609 Edema, unspecified: Secondary | ICD-10-CM

## 2011-08-23 DIAGNOSIS — I83893 Varicose veins of bilateral lower extremities with other complications: Secondary | ICD-10-CM

## 2011-08-23 DIAGNOSIS — M7989 Other specified soft tissue disorders: Secondary | ICD-10-CM

## 2011-08-23 NOTE — Progress Notes (Signed)
The patient presents today for evaluation of right leg swelling. She has a history of prior treatment at an outlying vein center with the laser ablation of her right great saphenous vein. She does have a history of progressive swelling over several years. She does have hemosiderin deposits around her ankles bilaterally. She reports that on the left this was related to a staph infection 30-40 years ago. In her at night she may outlying vein center from September 2012 was noted that she did have an enlarged lymph node in the right groin and underwent a CT scan of her abdomen to rule out any malignancy or tumor or other cause for this. This was reportedly normal. He reports that the swelling is less so in the morning and can become progressive throughout the day. She reports also at some time she has more swelling than others. She is compliant in her thigh high compression garment. She does not have any history of DVT.  Past Medical History  Diagnosis Date  . Hypertension   . Hyperlipidemia   . Mitral valve regurgitation   . Persistent atrial fibrillation   . S/P mitral valve replacement     09/24/2008  . Osteopenia   . Mitral valve prolapse   . Migraine headache   . Edema of leg     right leg    History  Substance Use Topics  . Smoking status: Former Smoker    Types: Cigarettes    Quit date: 02/01/1996  . Smokeless tobacco: Not on file  . Alcohol Use: Yes     1-2 drinks  weekly    Family History  Problem Relation Age of Onset  . Heart disease Father     Allergies  Allergen Reactions  . Augmentin (Amoxicillin-Pot Clavulanate)   . Macrobid (Nitrofurantoin Macrocrystal) Other (See Comments)    headache  . Nitrofurantoin Monohyd Macro Other (See Comments)    HA'S  . Septra (Bactrim) Swelling  . Sulfa Antibiotics Swelling  . Neosporin (Neomycin-Bacitracin Zn-Polymyx) Rash    Current outpatient prescriptions:aspirin 81 MG tablet, Take 81 mg by mouth daily.  , Disp: , Rfl: ;   atorvastatin (LIPITOR) 10 MG tablet, Take 20 mg by mouth daily. , Disp: , Rfl: ;  Calcium 1200-1000 MG-UNIT CHEW, Chew by mouth daily., Disp: , Rfl: ;  furosemide (LASIX) 40 MG tablet, Take 40 mg by mouth.  , Disp: , Rfl: ;  gabapentin (NEURONTIN) 600 MG tablet, Take 600 mg by mouth 2 (two) times daily.  , Disp: , Rfl:  Magnesium Oxide -Mg Supplement 250 MG TABS, Take 250 mg by mouth daily., Disp: , Rfl: ;  Melatonin 3 MG CAPS, Take 3 mg by mouth daily., Disp: , Rfl: ;  metoprolol succinate (TOPROL-XL) 25 MG 24 hr tablet, Take 25 mg by mouth daily.  , Disp: , Rfl: ;  Multiple Vitamin (MULTIVITAMIN) tablet, Take 1 tablet by mouth daily.  , Disp: , Rfl: ;  potassium chloride SA (K-DUR,KLOR-CON) 20 MEQ tablet, Take 20 mEq by mouth daily.  , Disp: , Rfl:  raloxifene (EVISTA) 60 MG tablet, Take 60 mg by mouth daily.  , Disp: , Rfl: ;  SUMAtriptan Succinate (IMITREX PO), Take 100 tablets by mouth daily as needed.  , Disp: , Rfl: ;  topiramate (TOPAMAX) 100 MG tablet, Take 200 mg by mouth daily. , Disp: , Rfl: ;  trimethoprim (TRIMPEX) 100 MG tablet, Take 100 mg by mouth daily. , Disp: , Rfl:   BP 116/76  Pulse 78  Resp 18  Ht 5\' 9"  (1.753 m)  Wt 163 lb 9.6 oz (74.208 kg)  BMI 24.16 kg/m2  Body mass index is 24.16 kg/(m^2).       Review systems is positive for swelling and pain in her legs otherwise negative  Physical exam: Well-developed well-nourished white female no acute distress HEENT normal Respirations nonlabored with equal breath sounds. Pulse status 2+ radial 2+ dorsalis pedis pulses bilaterally Skin with hemosiderin deposits around her ankles circumferentially bilaterally with no open ulceration no rashes Neurologically she is grossly intact  Venous duplex today reveals ablation of her right great saphenous vein with some trickle flow through the mid thigh but complete closure of the proximal thigh up to the saphenofemoral junction. She does have reflux in the right common femoral vein  and femoral vein and  Saphenofemoral junction.  Impression and plan: Chronic swelling and right leg. I discussed this at length with the patient. I do not see any correctable cause of this and she has had treatment of her great saphenous vein. There has been no suggestion of lymphedema from physical exam standpoint and CT scan reportedly shows no evidence of compression. She was reassured with this and is instructed not continued use of compression and elevation for symptomatic relief. She'll see [place] again on an as-needed basis

## 2011-08-29 DIAGNOSIS — N390 Urinary tract infection, site not specified: Secondary | ICD-10-CM | POA: Diagnosis not present

## 2011-08-29 DIAGNOSIS — R300 Dysuria: Secondary | ICD-10-CM | POA: Diagnosis not present

## 2011-09-23 DIAGNOSIS — G43009 Migraine without aura, not intractable, without status migrainosus: Secondary | ICD-10-CM | POA: Diagnosis not present

## 2011-11-23 DIAGNOSIS — Z23 Encounter for immunization: Secondary | ICD-10-CM | POA: Diagnosis not present

## 2011-12-26 DIAGNOSIS — R300 Dysuria: Secondary | ICD-10-CM | POA: Diagnosis not present

## 2012-02-06 DIAGNOSIS — R350 Frequency of micturition: Secondary | ICD-10-CM | POA: Diagnosis not present

## 2012-02-06 DIAGNOSIS — N3020 Other chronic cystitis without hematuria: Secondary | ICD-10-CM | POA: Diagnosis not present

## 2012-03-01 DIAGNOSIS — N390 Urinary tract infection, site not specified: Secondary | ICD-10-CM | POA: Diagnosis not present

## 2012-03-01 DIAGNOSIS — R300 Dysuria: Secondary | ICD-10-CM | POA: Diagnosis not present

## 2012-05-03 DIAGNOSIS — Z1231 Encounter for screening mammogram for malignant neoplasm of breast: Secondary | ICD-10-CM | POA: Diagnosis not present

## 2012-05-04 DIAGNOSIS — G43009 Migraine without aura, not intractable, without status migrainosus: Secondary | ICD-10-CM | POA: Diagnosis not present

## 2012-05-10 DIAGNOSIS — E782 Mixed hyperlipidemia: Secondary | ICD-10-CM | POA: Diagnosis not present

## 2012-05-10 DIAGNOSIS — I10 Essential (primary) hypertension: Secondary | ICD-10-CM | POA: Diagnosis not present

## 2012-06-21 DIAGNOSIS — E782 Mixed hyperlipidemia: Secondary | ICD-10-CM | POA: Diagnosis not present

## 2012-07-06 ENCOUNTER — Ambulatory Visit
Admission: RE | Admit: 2012-07-06 | Discharge: 2012-07-06 | Disposition: A | Discharge status: Home / Self Care | Payer: Medicare Other | Source: Ambulatory Visit | Attending: Physician Assistant | Admitting: Physician Assistant

## 2012-07-06 ENCOUNTER — Other Ambulatory Visit: Payer: Self-pay | Admitting: Physician Assistant

## 2012-07-06 DIAGNOSIS — M545 Low back pain, unspecified: Secondary | ICD-10-CM

## 2012-07-06 DIAGNOSIS — S79929A Unspecified injury of unspecified thigh, initial encounter: Secondary | ICD-10-CM | POA: Diagnosis not present

## 2012-07-06 DIAGNOSIS — M949 Disorder of cartilage, unspecified: Secondary | ICD-10-CM | POA: Diagnosis not present

## 2012-07-06 DIAGNOSIS — S79919A Unspecified injury of unspecified hip, initial encounter: Secondary | ICD-10-CM | POA: Diagnosis not present

## 2012-07-06 DIAGNOSIS — M899 Disorder of bone, unspecified: Secondary | ICD-10-CM | POA: Diagnosis not present

## 2012-07-06 DIAGNOSIS — E782 Mixed hyperlipidemia: Secondary | ICD-10-CM | POA: Diagnosis not present

## 2012-07-06 DIAGNOSIS — IMO0002 Reserved for concepts with insufficient information to code with codable children: Secondary | ICD-10-CM | POA: Diagnosis not present

## 2012-07-06 DIAGNOSIS — M25559 Pain in unspecified hip: Secondary | ICD-10-CM | POA: Diagnosis not present

## 2012-07-13 DIAGNOSIS — M545 Low back pain, unspecified: Secondary | ICD-10-CM | POA: Diagnosis not present

## 2012-07-17 DIAGNOSIS — M545 Low back pain, unspecified: Secondary | ICD-10-CM | POA: Diagnosis not present

## 2012-07-19 DIAGNOSIS — M545 Low back pain, unspecified: Secondary | ICD-10-CM | POA: Diagnosis not present

## 2012-07-24 DIAGNOSIS — M545 Low back pain, unspecified: Secondary | ICD-10-CM | POA: Diagnosis not present

## 2012-07-26 DIAGNOSIS — M545 Low back pain, unspecified: Secondary | ICD-10-CM | POA: Diagnosis not present

## 2012-07-31 DIAGNOSIS — M545 Low back pain, unspecified: Secondary | ICD-10-CM | POA: Diagnosis not present

## 2012-08-02 DIAGNOSIS — M545 Low back pain, unspecified: Secondary | ICD-10-CM | POA: Diagnosis not present

## 2012-08-07 DIAGNOSIS — M545 Low back pain, unspecified: Secondary | ICD-10-CM | POA: Diagnosis not present

## 2012-08-09 DIAGNOSIS — M545 Low back pain, unspecified: Secondary | ICD-10-CM | POA: Diagnosis not present

## 2012-08-15 DIAGNOSIS — M545 Low back pain, unspecified: Secondary | ICD-10-CM | POA: Diagnosis not present

## 2012-08-20 DIAGNOSIS — N390 Urinary tract infection, site not specified: Secondary | ICD-10-CM | POA: Diagnosis not present

## 2012-08-20 DIAGNOSIS — R300 Dysuria: Secondary | ICD-10-CM | POA: Diagnosis not present

## 2012-11-16 DIAGNOSIS — Z23 Encounter for immunization: Secondary | ICD-10-CM | POA: Diagnosis not present

## 2012-11-26 DIAGNOSIS — E782 Mixed hyperlipidemia: Secondary | ICD-10-CM | POA: Diagnosis not present

## 2012-11-26 DIAGNOSIS — I10 Essential (primary) hypertension: Secondary | ICD-10-CM | POA: Diagnosis not present

## 2012-11-26 DIAGNOSIS — I4891 Unspecified atrial fibrillation: Secondary | ICD-10-CM | POA: Diagnosis not present

## 2012-11-26 DIAGNOSIS — I059 Rheumatic mitral valve disease, unspecified: Secondary | ICD-10-CM | POA: Diagnosis not present

## 2012-12-31 DIAGNOSIS — G43009 Migraine without aura, not intractable, without status migrainosus: Secondary | ICD-10-CM | POA: Diagnosis not present

## 2013-01-15 ENCOUNTER — Encounter (INDEPENDENT_AMBULATORY_CARE_PROVIDER_SITE_OTHER): Payer: Self-pay

## 2013-01-15 ENCOUNTER — Encounter: Payer: Self-pay | Admitting: Interventional Cardiology

## 2013-01-15 ENCOUNTER — Ambulatory Visit (INDEPENDENT_AMBULATORY_CARE_PROVIDER_SITE_OTHER): Payer: Medicare Other | Admitting: Interventional Cardiology

## 2013-01-15 VITALS — BP 124/80 | HR 81 | Ht 68.0 in | Wt 160.0 lb

## 2013-01-15 DIAGNOSIS — R609 Edema, unspecified: Secondary | ICD-10-CM

## 2013-01-15 DIAGNOSIS — Z954 Presence of other heart-valve replacement: Secondary | ICD-10-CM

## 2013-01-15 DIAGNOSIS — I4891 Unspecified atrial fibrillation: Secondary | ICD-10-CM

## 2013-01-15 DIAGNOSIS — Z952 Presence of prosthetic heart valve: Secondary | ICD-10-CM

## 2013-01-15 DIAGNOSIS — E785 Hyperlipidemia, unspecified: Secondary | ICD-10-CM

## 2013-01-15 DIAGNOSIS — I4819 Other persistent atrial fibrillation: Secondary | ICD-10-CM

## 2013-01-15 NOTE — Progress Notes (Signed)
Patient ID: Tracy [name], female   DOB: Mar 14, 1935, 77 y.o.   MRN: [id number]    [address] [place], [place]  [address] Phone: [phone] Fax:  [phone]  Date:  01/15/2013   ID:  Tracy [name], DOB 1935-08-13, MRN [id number]  PCP:  Allean Found, MD      History of Present Illness: Tracy [name] is a 77 y.o. female  who had MVR in 8/10. SH ehad some persistent right leg swelling. SHe ultimately had some vein injection done. SHe is wearing compression hose.  It was thought to be due to some lymph nodes at the right hip per her report. Atrial Fibrillation F/U:  c/o Leg edema right leg..  Denies : Chest pain.  Dizziness.  Orthopnea.  Palpitations.  Shortness of breath.  Syncope.    Vital Signs      Wt Readings from Last 3 Encounters:  01/15/13 160 lb (72.576 kg)  08/23/11 163 lb 9.6 oz (74.208 kg)  10/11/10 165 lb (74.844 kg)     Past Medical History  Diagnosis Date  . Hypertension   . Hyperlipidemia   . Mitral valve regurgitation   . Persistent atrial fibrillation   . S/P mitral valve replacement     09/24/2008  . Osteopenia   . Mitral valve prolapse   . Migraine headache   . Edema of leg     right leg    Current Outpatient Prescriptions  Medication Sig Dispense Refill  . aspirin 81 MG tablet Take 81 mg by mouth daily.        . Calcium 1200-1000 MG-UNIT CHEW Chew by mouth daily.      . colesevelam (WELCHOL) 625 MG tablet Take by mouth. Take 3 tablets twice daily by mouth      . furosemide (LASIX) 40 MG tablet Take 40 mg by mouth.        . gabapentin (NEURONTIN) 600 MG tablet Take 600 mg by mouth 2 (two) times daily.        . metoprolol succinate (TOPROL-XL) 25 MG 24 hr tablet Take 25 mg by mouth daily.        . Multiple Vitamin (MULTIVITAMIN) tablet Take 1 tablet by mouth daily.        . potassium chloride SA (K-DUR,KLOR-CON) 20 MEQ tablet Take 20 mEq by mouth daily.        . SUMAtriptan Succinate (IMITREX PO) Take 100  tablets by mouth daily as needed.        . topiramate (TOPAMAX) 100 MG tablet Take 200 mg by mouth daily.       Marland Kitchen trimethoprim (TRIMPEX) 100 MG tablet Take 100 mg by mouth daily.        No current facility-administered medications for this visit.    Allergies:    Allergies  Allergen Reactions  . Augmentin [Amoxicillin-Pot Clavulanate]   . Macrobid [Nitrofurantoin Macrocrystal] Other (See Comments)    headache  . Nitrofurantoin Monohyd Macro Other (See Comments)    HA'S  . Septra [Bactrim] Swelling  . Sulfa Antibiotics Swelling  . Neosporin [Neomycin-Bacitracin Zn-Polymyx] Rash    Social History:  The patient  reports that she quit smoking about 16 years ago. Her smoking use included Cigarettes. She smoked 0.00 packs per day. She does not have any smokeless tobacco history on file. She reports that she drinks alcohol. She reports that she does not use illicit drugs.   Family History:  The patient's family history includes Heart disease  in her father.   ROS:  Please see the history of present illness.  No nausea, vomiting.  No fevers, chills.  No focal weakness.  No dysuria. Back pain   All other systems reviewed and negative.   PHYSICAL EXAM: VS:  BP 124/80  Pulse 81  Ht 5\' 8"  (1.727 m)  Wt 160 lb (72.576 kg)  BMI 24.33 kg/m2 Well nourished, well developed, in no acute distress HEENT: normal Neck: no JVD, no carotid bruits Cardiac:  normal S1, S2; RRR;  Lungs:  clear to auscultation bilaterally, no wheezing, rhonchi or rales Abd: soft, nontender, no hepatomegaly Ext: no edema Skin: warm and dry Neuro:   no focal abnormalities noted  EKG:  NSR, No ST segment changes    ASSESSMENT AND PLAN:  Atrial fibrillation  In NSR. No need for coumadin from a cardiac standpoint. S/p Maze at time of valve replacement.   2. Heart valve replaced by other means  Continue SBE prophylaxis with dental procedures. LV and RV function along with valvular function were normal. Edema does not  appear to be from a cardiac source.    3. Mixed hyperlipidemia  Off of Lipitor Tablet, 20 MG, 1 tablet, by mouth, Once a day LDL 91. HDL > 40. On lipitor in te past.  Now LDL 131 off of lipitor.  She stopped lipitor due to muscle pains.  Could consider pravastatin or crestor as well if needed. COntinue wellchol for now.  Try for 5 days per week, 30 minutes/day of exercise.    4. Chronic edema  Right leg swollen. No clear etiology. Not cardiac in origin based on echo and exam.      Signed, [name], MD, [facility] 01/15/2013 12:08 PM

## 2013-01-15 NOTE — Patient Instructions (Signed)
Your physician recommends that you continue on your current medications as directed. Please refer to the Current Medication list given to you today.  Your physician wants you to follow-up in: 1 year with Dr. Varanasi. You will receive a reminder letter in the mail two months in advance. If you don't receive a letter, please call our office to schedule the follow-up appointment.  

## 2013-02-18 DIAGNOSIS — R350 Frequency of micturition: Secondary | ICD-10-CM | POA: Diagnosis not present

## 2013-02-18 DIAGNOSIS — N3020 Other chronic cystitis without hematuria: Secondary | ICD-10-CM | POA: Diagnosis not present

## 2013-03-08 DIAGNOSIS — N390 Urinary tract infection, site not specified: Secondary | ICD-10-CM | POA: Diagnosis not present

## 2013-04-02 DIAGNOSIS — I10 Essential (primary) hypertension: Secondary | ICD-10-CM | POA: Diagnosis not present

## 2013-04-02 DIAGNOSIS — Z0000 Encounter for general adult medical examination without abnormal findings: Secondary | ICD-10-CM | POA: Diagnosis not present

## 2013-04-02 DIAGNOSIS — L989 Disorder of the skin and subcutaneous tissue, unspecified: Secondary | ICD-10-CM | POA: Diagnosis not present

## 2013-04-02 DIAGNOSIS — Z23 Encounter for immunization: Secondary | ICD-10-CM | POA: Diagnosis not present

## 2013-04-02 DIAGNOSIS — E782 Mixed hyperlipidemia: Secondary | ICD-10-CM | POA: Diagnosis not present

## 2013-04-05 DIAGNOSIS — D485 Neoplasm of uncertain behavior of skin: Secondary | ICD-10-CM | POA: Diagnosis not present

## 2013-04-05 DIAGNOSIS — L719 Rosacea, unspecified: Secondary | ICD-10-CM | POA: Diagnosis not present

## 2013-04-08 DIAGNOSIS — D0430 Carcinoma in situ of skin of unspecified part of face: Secondary | ICD-10-CM | POA: Diagnosis not present

## 2013-04-08 DIAGNOSIS — D0439 Carcinoma in situ of skin of other parts of face: Secondary | ICD-10-CM | POA: Diagnosis not present

## 2013-05-06 DIAGNOSIS — M899 Disorder of bone, unspecified: Secondary | ICD-10-CM | POA: Diagnosis not present

## 2013-05-06 DIAGNOSIS — Z1231 Encounter for screening mammogram for malignant neoplasm of breast: Secondary | ICD-10-CM | POA: Diagnosis not present

## 2013-05-06 DIAGNOSIS — M949 Disorder of cartilage, unspecified: Secondary | ICD-10-CM | POA: Diagnosis not present

## 2013-05-28 DIAGNOSIS — D0439 Carcinoma in situ of skin of other parts of face: Secondary | ICD-10-CM | POA: Diagnosis not present

## 2013-05-28 DIAGNOSIS — C4499 Other specified malignant neoplasm of skin, unspecified: Secondary | ICD-10-CM | POA: Diagnosis not present

## 2013-05-28 DIAGNOSIS — D0430 Carcinoma in situ of skin of unspecified part of face: Secondary | ICD-10-CM | POA: Diagnosis not present

## 2013-06-06 DIAGNOSIS — Z711 Person with feared health complaint in whom no diagnosis is made: Secondary | ICD-10-CM | POA: Diagnosis not present

## 2013-07-02 DIAGNOSIS — N390 Urinary tract infection, site not specified: Secondary | ICD-10-CM | POA: Diagnosis not present

## 2013-07-03 DIAGNOSIS — E782 Mixed hyperlipidemia: Secondary | ICD-10-CM | POA: Diagnosis not present

## 2013-07-04 DIAGNOSIS — G43009 Migraine without aura, not intractable, without status migrainosus: Secondary | ICD-10-CM | POA: Diagnosis not present

## 2013-08-22 DIAGNOSIS — R300 Dysuria: Secondary | ICD-10-CM | POA: Diagnosis not present

## 2013-08-22 DIAGNOSIS — N390 Urinary tract infection, site not specified: Secondary | ICD-10-CM | POA: Diagnosis not present

## 2013-09-19 DIAGNOSIS — Z85828 Personal history of other malignant neoplasm of skin: Secondary | ICD-10-CM | POA: Diagnosis not present

## 2013-09-19 DIAGNOSIS — D239 Other benign neoplasm of skin, unspecified: Secondary | ICD-10-CM | POA: Diagnosis not present

## 2013-09-19 DIAGNOSIS — D1801 Hemangioma of skin and subcutaneous tissue: Secondary | ICD-10-CM | POA: Diagnosis not present

## 2013-09-19 DIAGNOSIS — L819 Disorder of pigmentation, unspecified: Secondary | ICD-10-CM | POA: Diagnosis not present

## 2013-09-19 DIAGNOSIS — L821 Other seborrheic keratosis: Secondary | ICD-10-CM | POA: Diagnosis not present

## 2013-09-19 DIAGNOSIS — L719 Rosacea, unspecified: Secondary | ICD-10-CM | POA: Diagnosis not present

## 2013-10-09 DIAGNOSIS — I10 Essential (primary) hypertension: Secondary | ICD-10-CM | POA: Diagnosis not present

## 2013-10-09 DIAGNOSIS — E782 Mixed hyperlipidemia: Secondary | ICD-10-CM | POA: Diagnosis not present

## 2013-10-09 DIAGNOSIS — I4891 Unspecified atrial fibrillation: Secondary | ICD-10-CM | POA: Diagnosis not present

## 2013-11-18 DIAGNOSIS — N390 Urinary tract infection, site not specified: Secondary | ICD-10-CM | POA: Diagnosis not present

## 2013-11-18 DIAGNOSIS — Z23 Encounter for immunization: Secondary | ICD-10-CM | POA: Diagnosis not present

## 2014-01-02 DIAGNOSIS — G43009 Migraine without aura, not intractable, without status migrainosus: Secondary | ICD-10-CM | POA: Diagnosis not present

## 2014-01-10 DIAGNOSIS — R350 Frequency of micturition: Secondary | ICD-10-CM | POA: Diagnosis not present

## 2014-01-10 DIAGNOSIS — N390 Urinary tract infection, site not specified: Secondary | ICD-10-CM | POA: Diagnosis not present

## 2014-01-13 ENCOUNTER — Ambulatory Visit (INDEPENDENT_AMBULATORY_CARE_PROVIDER_SITE_OTHER): Payer: Medicare Other | Admitting: Interventional Cardiology

## 2014-01-13 ENCOUNTER — Encounter: Payer: Self-pay | Admitting: Interventional Cardiology

## 2014-01-13 VITALS — BP 108/76 | HR 75 | Ht 68.0 in | Wt 151.8 lb

## 2014-01-13 DIAGNOSIS — Z952 Presence of prosthetic heart valve: Secondary | ICD-10-CM

## 2014-01-13 DIAGNOSIS — I10 Essential (primary) hypertension: Secondary | ICD-10-CM | POA: Diagnosis not present

## 2014-01-13 DIAGNOSIS — E785 Hyperlipidemia, unspecified: Secondary | ICD-10-CM | POA: Diagnosis not present

## 2014-01-13 DIAGNOSIS — Z954 Presence of other heart-valve replacement: Secondary | ICD-10-CM

## 2014-01-13 DIAGNOSIS — I4819 Other persistent atrial fibrillation: Secondary | ICD-10-CM

## 2014-01-13 DIAGNOSIS — R609 Edema, unspecified: Secondary | ICD-10-CM

## 2014-01-13 DIAGNOSIS — I481 Persistent atrial fibrillation: Secondary | ICD-10-CM

## 2014-01-13 NOTE — Patient Instructions (Signed)
Your physician recommends that you continue on your current medications as directed. Please refer to the Current Medication list given to you today.  Your physician wants you to follow-up in: 1 year with Dr. Varanasi. You will receive a reminder letter in the mail two months in advance. If you don't receive a letter, please call our office to schedule the follow-up appointment.  

## 2014-01-13 NOTE — Progress Notes (Signed)
Patient ID: CAELYNN MARSHMAN, female   DOB: 02-Feb-1935, 78 y.o.   MRN: [id number] Patient ID: MONEY MCKEITHAN, female   DOB: 11-Jul-1935, 78 y.o.   MRN: [id number]    [place] [place], [place]  [address] Phone: [phone] Fax:  [phone]  Date:  01/13/2014   ID:  RITHIKA SEEL, DOB [birth year], MRN [id number]  PCP:  Reginia Naas, MD      History of Present Illness: EVI MCCOMB is a 78 y.o. female  who had MVR in 8/10. She had some persistent right leg swelling. SHe ultimately had some vein injection done. SHe is wearing compression hose.  It was thought to be due to some lymph nodes at the right hip per her report.  Atrial Fibrillation F/U:  c/o Leg edema right leg..  Denies : Chest pain.  Dizziness.  Orthopnea.  Palpitations.  Shortness of breath.  Syncope.    She walks several times a week.  She uses a rowing machine.  No cardiac sx at this time.  No sx like her AFib.     Wears compression stocking for the right leg.   Wt Readings from Last 3 Encounters:  01/13/14 151 lb 12.8 oz (68.856 kg)  01/15/13 160 lb (72.576 kg)  08/23/11 163 lb 9.6 oz (74.208 kg)     Past Medical History  Diagnosis Date  . Hypertension   . Hyperlipidemia   . Mitral valve regurgitation   . Persistent atrial fibrillation   . S/P mitral valve replacement     09/24/2008  . Osteopenia   . Mitral valve prolapse   . Migraine headache   . Edema of leg     right leg    Current Outpatient Prescriptions  Medication Sig Dispense Refill  . aspirin 81 MG tablet Take 81 mg by mouth daily.      . Calcium 1200-1000 MG-UNIT CHEW Chew by mouth daily.    . ciprofloxacin (CIPRO) 500 MG tablet FOR A BLADDER INFECTION    . colesevelam (WELCHOL) 625 MG tablet Take by mouth. Take 3 tablets twice daily by mouth    . furosemide (LASIX) 40 MG tablet Take 40 mg by mouth.      . gabapentin (NEURONTIN) 600 MG tablet Take 600 mg by mouth 2 (two) times daily.      . metoprolol  succinate (TOPROL-XL) 25 MG 24 hr tablet Take 25 mg by mouth daily.      . Multiple Vitamin (MULTIVITAMIN) tablet Take 1 tablet by mouth daily.      . potassium chloride SA (K-DUR,KLOR-CON) 20 MEQ tablet Take 20 mEq by mouth daily.      . SUMAtriptan Succinate (IMITREX PO) Take 1 tablet by mouth daily as needed.     . topiramate (TOPAMAX) 100 MG tablet Take 200 mg by mouth daily.     Marland Kitchen trimethoprim (TRIMPEX) 100 MG tablet Take 100 mg by mouth daily.      No current facility-administered medications for this visit.    Allergies:    Allergies  Allergen Reactions  . Augmentin [Amoxicillin-Pot Clavulanate]   . Macrobid [Nitrofurantoin Macrocrystal] Other (See Comments)    headache  . Nitrofurantoin Monohyd Macro Other (See Comments)    HA'S  . Septra [Bactrim] Swelling  . Sulfa Antibiotics Swelling  . Neosporin [Neomycin-Bacitracin Zn-Polymyx] Rash    Social History:  The patient  reports that she quit smoking about 17 years ago. Her smoking use included Cigarettes. She smoked 0.00  packs per day. She does not have any smokeless tobacco history on file. She reports that she drinks alcohol. She reports that she does not use illicit drugs.   Family History:  The patient's family history includes Heart disease in her father.   ROS:  Please see the history of present illness.  No nausea, vomiting.  No fevers, chills.  No focal weakness.  No dysuria. Back pain   All other systems reviewed and negative.   PHYSICAL EXAM: VS:  BP 108/76 mmHg  Pulse 75  Ht 5\' 8"  (1.727 m)  Wt 151 lb 12.8 oz (68.856 kg)  BMI 23.09 kg/m2 Well nourished, well developed, in no acute distress HEENT: normal Neck: no JVD, no carotid bruits Cardiac:  normal S1, S2; RRR;  Lungs:  clear to auscultation bilaterally, no wheezing, rhonchi or rales Abd: soft, nontender, no hepatomegaly Ext: no edema Skin: warm and dry Neuro:   no focal abnormalities noted Psych: normal affect  EKG:  NSR, No ST segment changes     ASSESSMENT AND PLAN:  Atrial fibrillation  In NSR. No need for coumadin from a cardiac standpoint. S/p Maze at time of valve replacement in 2010.  Continue Toprol.   2. Heart valve replaced by other means  Continue SBE prophylaxis with dental procedures. LV and RV function along with valvular function were normal. Edema does not appear to be from a cardiac source.    3. Mixed hyperlipidemia  Off of Lipitor Tablet, 20 MG, 1 tablet, by mouth, Once a day  She stopped lipitor due to muscle pains.  Could consider pravastatin or crestor as well if needed in the future. Tolerating welchol for now.  Try for 5 days per week, 30 minutes/day of exercise.   Eating a healthier diet.  Followed by PMD.   4. Chronic edema  Right leg swollen. No clear etiology. Not cardiac in origin based on echo and exam.  Chronic.  May be related to lymphatic drainage issue in the right leg.  Improved.     Signed, Mina Marble, MD, [facility] 01/13/2014 10:50 AM

## 2014-02-27 DIAGNOSIS — N3020 Other chronic cystitis without hematuria: Secondary | ICD-10-CM | POA: Diagnosis not present

## 2014-05-22 DIAGNOSIS — M1990 Unspecified osteoarthritis, unspecified site: Secondary | ICD-10-CM | POA: Diagnosis not present

## 2014-05-22 DIAGNOSIS — Z0000 Encounter for general adult medical examination without abnormal findings: Secondary | ICD-10-CM | POA: Diagnosis not present

## 2014-05-22 DIAGNOSIS — Z1389 Encounter for screening for other disorder: Secondary | ICD-10-CM | POA: Diagnosis not present

## 2014-05-22 DIAGNOSIS — H9190 Unspecified hearing loss, unspecified ear: Secondary | ICD-10-CM | POA: Diagnosis not present

## 2014-05-22 DIAGNOSIS — E782 Mixed hyperlipidemia: Secondary | ICD-10-CM | POA: Diagnosis not present

## 2014-05-22 DIAGNOSIS — Z1231 Encounter for screening mammogram for malignant neoplasm of breast: Secondary | ICD-10-CM | POA: Diagnosis not present

## 2014-05-22 DIAGNOSIS — R609 Edema, unspecified: Secondary | ICD-10-CM | POA: Diagnosis not present

## 2014-05-22 DIAGNOSIS — I10 Essential (primary) hypertension: Secondary | ICD-10-CM | POA: Diagnosis not present

## 2014-05-22 DIAGNOSIS — Z23 Encounter for immunization: Secondary | ICD-10-CM | POA: Diagnosis not present

## 2014-05-22 DIAGNOSIS — G43909 Migraine, unspecified, not intractable, without status migrainosus: Secondary | ICD-10-CM | POA: Diagnosis not present

## 2014-07-10 DIAGNOSIS — G43009 Migraine without aura, not intractable, without status migrainosus: Secondary | ICD-10-CM | POA: Diagnosis not present

## 2014-10-03 DIAGNOSIS — N390 Urinary tract infection, site not specified: Secondary | ICD-10-CM | POA: Diagnosis not present

## 2014-10-24 ENCOUNTER — Encounter: Payer: Self-pay | Admitting: Interventional Cardiology

## 2014-11-12 DIAGNOSIS — Z952 Presence of prosthetic heart valve: Secondary | ICD-10-CM | POA: Diagnosis not present

## 2014-11-12 DIAGNOSIS — E782 Mixed hyperlipidemia: Secondary | ICD-10-CM | POA: Diagnosis not present

## 2014-11-12 DIAGNOSIS — I10 Essential (primary) hypertension: Secondary | ICD-10-CM | POA: Diagnosis not present

## 2014-11-12 DIAGNOSIS — G43909 Migraine, unspecified, not intractable, without status migrainosus: Secondary | ICD-10-CM | POA: Diagnosis not present

## 2014-11-12 DIAGNOSIS — I480 Paroxysmal atrial fibrillation: Secondary | ICD-10-CM | POA: Diagnosis not present

## 2014-11-12 DIAGNOSIS — Z23 Encounter for immunization: Secondary | ICD-10-CM | POA: Diagnosis not present

## 2015-01-08 DIAGNOSIS — G43009 Migraine without aura, not intractable, without status migrainosus: Secondary | ICD-10-CM | POA: Diagnosis not present

## 2015-02-05 DIAGNOSIS — R350 Frequency of micturition: Secondary | ICD-10-CM | POA: Diagnosis not present

## 2015-02-05 DIAGNOSIS — N3020 Other chronic cystitis without hematuria: Secondary | ICD-10-CM | POA: Diagnosis not present

## 2015-02-05 DIAGNOSIS — Z0000 Encounter for general adult medical examination without abnormal findings: Secondary | ICD-10-CM | POA: Diagnosis not present

## 2015-02-19 ENCOUNTER — Encounter: Payer: Self-pay | Admitting: Interventional Cardiology

## 2015-02-19 ENCOUNTER — Ambulatory Visit (INDEPENDENT_AMBULATORY_CARE_PROVIDER_SITE_OTHER): Payer: Medicare Other | Admitting: Interventional Cardiology

## 2015-02-19 VITALS — BP 120/84 | HR 72 | Ht 68.0 in | Wt 153.0 lb

## 2015-02-19 DIAGNOSIS — Z954 Presence of other heart-valve replacement: Secondary | ICD-10-CM

## 2015-02-19 DIAGNOSIS — I340 Nonrheumatic mitral (valve) insufficiency: Secondary | ICD-10-CM | POA: Diagnosis not present

## 2015-02-19 DIAGNOSIS — Z952 Presence of prosthetic heart valve: Secondary | ICD-10-CM

## 2015-02-19 DIAGNOSIS — R600 Localized edema: Secondary | ICD-10-CM

## 2015-02-19 DIAGNOSIS — E785 Hyperlipidemia, unspecified: Secondary | ICD-10-CM | POA: Diagnosis not present

## 2015-02-19 DIAGNOSIS — I10 Essential (primary) hypertension: Secondary | ICD-10-CM

## 2015-02-19 NOTE — Patient Instructions (Signed)
**Note De-Identified Briseidy Spark Obfuscation** Medication Instructions:  Same-no changes  Labwork: none  Testing/Procedures: none  Follow-Up: Your physician wants you to follow-up in: 1 year. You will receive a reminder letter in the mail two months in advance. If you don't receive a letter, please call our office to schedule the follow-up appointment.   Any Other Special Instructions Will Be Listed Below (If Applicable). The cholesterol medication that Dr Irish Lack will replace Welchol with is Crestor 10 mg     If you need a refill on your cardiac medications before your next appointment, please call your pharmacy.

## 2015-02-19 NOTE — Progress Notes (Signed)
Patient ID: Tracy [name], female   DOB: 02-22-1935, 80 y.o.   MRN: [id number]     Cardiology Office Note   Date:  02/19/2015   ID:  Tracy [name], DOB [birth year], MRN [id number]  PCP:  Reginia Naas, MD    No chief complaint on file.    Wt Readings from Last 3 Encounters:  02/19/15 153 lb (69.4 kg)  01/13/14 151 lb 12.8 oz (68.856 kg)  01/15/13 160 lb (72.576 kg)       History of Present Illness: Tracy [name] is a 80 y.o. female  who had MVR in 8/10. She had some persistent right leg swelling. SHe ultimately had some vein injection done. SHe is wearing compression hose. It was thought to be due to some lymph nodes at the right hip per her report.  She walks several times a week. She lifts light weights as well. She uses a rowing machine if the weather is bad. No cardiac sx at this time. No sx like her AFib.          She is going to [place] for a couple of months in February.  No palpitations.  SHe plans to ride a bike and play golf.   She takes antibiotics when she goes to the dentist.       Past Medical History  Diagnosis Date  . Hypertension   . Hyperlipidemia   . Mitral valve regurgitation   . Persistent atrial fibrillation ([place])   . S/P mitral valve replacement     09/24/2008  . Osteopenia   . Mitral valve prolapse   . Migraine headache   . Edema of leg     right leg    Past Surgical History  Procedure Laterality Date  . Mitral valve replacement  0825/2010    79mm Medtronic Mosaic bioprosthetic tissue valve  . Cox-maze microwave ablation  09/24/2008    complete biatrial lesion set  . Abdominal hysterectomy  1978    partial  . Bladder suspension    . Endovenous ablation saphenous vein w/ laser  10-12-2010    right greater saphenous vein Elza Rafter MD     Current Outpatient Prescriptions  Medication Sig Dispense Refill  . aspirin 81 MG tablet Take 81 mg by mouth daily.      . Calcium 1200-1000 MG-UNIT CHEW Chew by mouth  daily.    . ciprofloxacin (CIPRO) 500 MG tablet Take 500 mg by mouth continuous as needed (FOR BLADDER INFECTION). FOR A BLADDER INFECTION    . colesevelam (WELCHOL) 625 MG tablet Take by mouth. Take 3 tablets twice daily by mouth    . furosemide (LASIX) 40 MG tablet Take 40 mg by mouth.      . gabapentin (NEURONTIN) 600 MG tablet Take 600 mg by mouth 2 (two) times daily.      . metoprolol succinate (TOPROL-XL) 25 MG 24 hr tablet Take 25 mg by mouth daily.      . Multiple Vitamin (MULTIVITAMIN) tablet Take 1 tablet by mouth daily.      . potassium chloride SA (K-DUR,KLOR-CON) 20 MEQ tablet Take 20 mEq by mouth daily.      . SUMAtriptan Succinate (IMITREX PO) Take 1 tablet by mouth daily as needed (MIGRAINES).     Marland Kitchen topiramate (TOPAMAX) 100 MG tablet Take 200 mg by mouth daily.     Marland Kitchen trimethoprim (TRIMPEX) 100 MG tablet Take 100 mg by mouth daily.      No current facility-administered medications for this  visit.    Allergies:   Augmentin; Bacitracin-neomycin-polymyxin; Macrobid; Monosodium glutamate; Nitrofurantoin; Nitrofurantoin monohyd macro; Septra; Sulfa antibiotics; and Neosporin    Social History:  The patient  reports that she quit smoking about 19 years ago. Her smoking use included Cigarettes. She does not have any smokeless tobacco history on file. She reports that she drinks alcohol. She reports that she does not use illicit drugs.   Family History:  The patient's family history includes Heart disease in her father; Stroke in her maternal grandmother. There is no history of Diabetes or Heart attack.    ROS:  Please see the history of present illness.   Otherwise, review of systems are positive for right leg swelling-chronic.   All other systems are reviewed and negative.    PHYSICAL EXAM: VS:  BP 120/84 mmHg  Pulse 72  Ht 5\' 8"  (1.727 m)  Wt 153 lb (69.4 kg)  BMI 23.27 kg/m2 , BMI Body mass index is 23.27 kg/(m^2). GEN: Well nourished, well developed, in no acute  distress HEENT: normal Neck: no JVD, carotid bruits, or masses Cardiac: RRR; no murmurs, rubs, or gallops, mild right leg edema  Respiratory:  clear to auscultation bilaterally, normal work of breathing GI: soft, nontender, nondistended, + BS MS: no deformity or atrophy Skin: warm and dry, no rash Neuro:  Strength and sensation are intact Psych: euthymic mood, full affect   EKG:   The ekg ordered today demonstrates NSR, PACs   Recent Labs: No results found for requested labs within last 365 days.   Lipid Panel No results found for: CHOL, TRIG, HDL, CHOLHDL, VLDL, LDLCALC, LDLDIRECT   Other studies Reviewed: Additional studies/ records that were reviewed today with results demonstrating: Normal EF in 8/12.   ASSESSMENT AND PLAN:  1. Paroxysmal AFib: In NSR now. No need for coumadin from a cardiac standpoint. S/p Maze at time of valve replacement in 2010. Continue Toprol.  Consider repeat echo in one year.  MV replacement:  Continue SBE prophylaxis with dental procedures. LV and RV function along with valvular function were normal in 2012.  Exam is unremarkable. Edema does not appear to be from a cardiac source. Refill amoxicillin.  She has been tolerating amoxicillin despite her allergy to Augmentin.           2. Hyperlipidemia: Welchol.  Intolerant of lipitor in the past due to myalgia in the leg.  TC was 201 at last check with her Dr. Carol Ada.  Could consider rosuvastatin if LDL increases.  Welchol is quite expensive for her.  Check if rosuvastatin would be cheaper with her pharmacy, based on her insurance.  3. Chronic right leg edema.  May be lymph related.     Current medicines are reviewed at length with the patient today.  The patient concerns regarding her medicines were addressed.  The following changes have been made:  No change- would look into checking on the price of rosuvastatin  Labs/ tests ordered today include:  No orders of the defined types were  placed in this encounter.    Recommend 150 minutes/week of aerobic exercise Low fat, low carb, high fiber diet recommended  Disposition:   FU in  1 year   Teresita Madura., MD  02/19/2015 11:08 AM    [facility] [place], [place], [place]  [address] Phone: [phone]; Fax: [phone]

## 2015-05-11 DIAGNOSIS — M8589 Other specified disorders of bone density and structure, multiple sites: Secondary | ICD-10-CM | POA: Diagnosis not present

## 2015-05-25 DIAGNOSIS — Z1231 Encounter for screening mammogram for malignant neoplasm of breast: Secondary | ICD-10-CM | POA: Diagnosis not present

## 2015-05-26 DIAGNOSIS — M8589 Other specified disorders of bone density and structure, multiple sites: Secondary | ICD-10-CM | POA: Diagnosis not present

## 2015-05-26 DIAGNOSIS — L603 Nail dystrophy: Secondary | ICD-10-CM | POA: Diagnosis not present

## 2015-05-26 DIAGNOSIS — I480 Paroxysmal atrial fibrillation: Secondary | ICD-10-CM | POA: Diagnosis not present

## 2015-05-26 DIAGNOSIS — K144 Atrophy of tongue papillae: Secondary | ICD-10-CM | POA: Diagnosis not present

## 2015-05-26 DIAGNOSIS — I10 Essential (primary) hypertension: Secondary | ICD-10-CM | POA: Diagnosis not present

## 2015-05-26 DIAGNOSIS — Z0001 Encounter for general adult medical examination with abnormal findings: Secondary | ICD-10-CM | POA: Diagnosis not present

## 2015-05-26 DIAGNOSIS — Z1211 Encounter for screening for malignant neoplasm of colon: Secondary | ICD-10-CM | POA: Diagnosis not present

## 2015-05-26 DIAGNOSIS — E782 Mixed hyperlipidemia: Secondary | ICD-10-CM | POA: Diagnosis not present

## 2015-05-26 DIAGNOSIS — Z1389 Encounter for screening for other disorder: Secondary | ICD-10-CM | POA: Diagnosis not present

## 2015-06-26 DIAGNOSIS — Z1211 Encounter for screening for malignant neoplasm of colon: Secondary | ICD-10-CM | POA: Diagnosis not present

## 2015-07-09 DIAGNOSIS — G43009 Migraine without aura, not intractable, without status migrainosus: Secondary | ICD-10-CM | POA: Diagnosis not present

## 2015-08-26 DIAGNOSIS — N3000 Acute cystitis without hematuria: Secondary | ICD-10-CM | POA: Diagnosis not present

## 2015-08-26 DIAGNOSIS — R300 Dysuria: Secondary | ICD-10-CM | POA: Diagnosis not present

## 2015-11-02 DIAGNOSIS — L719 Rosacea, unspecified: Secondary | ICD-10-CM | POA: Diagnosis not present

## 2015-11-02 DIAGNOSIS — D485 Neoplasm of uncertain behavior of skin: Secondary | ICD-10-CM | POA: Diagnosis not present

## 2015-11-02 DIAGNOSIS — Z23 Encounter for immunization: Secondary | ICD-10-CM | POA: Diagnosis not present

## 2015-11-03 DIAGNOSIS — D0439 Carcinoma in situ of skin of other parts of face: Secondary | ICD-10-CM | POA: Diagnosis not present

## 2015-11-03 DIAGNOSIS — D0471 Carcinoma in situ of skin of right lower limb, including hip: Secondary | ICD-10-CM | POA: Diagnosis not present

## 2015-11-25 DIAGNOSIS — G43909 Migraine, unspecified, not intractable, without status migrainosus: Secondary | ICD-10-CM | POA: Diagnosis not present

## 2015-11-25 DIAGNOSIS — I480 Paroxysmal atrial fibrillation: Secondary | ICD-10-CM | POA: Diagnosis not present

## 2015-11-25 DIAGNOSIS — I10 Essential (primary) hypertension: Secondary | ICD-10-CM | POA: Diagnosis not present

## 2015-11-25 DIAGNOSIS — Z952 Presence of prosthetic heart valve: Secondary | ICD-10-CM | POA: Diagnosis not present

## 2015-11-25 DIAGNOSIS — Z23 Encounter for immunization: Secondary | ICD-10-CM | POA: Diagnosis not present

## 2015-11-25 DIAGNOSIS — E782 Mixed hyperlipidemia: Secondary | ICD-10-CM | POA: Diagnosis not present

## 2015-11-25 DIAGNOSIS — R609 Edema, unspecified: Secondary | ICD-10-CM | POA: Diagnosis not present

## 2015-11-25 DIAGNOSIS — N390 Urinary tract infection, site not specified: Secondary | ICD-10-CM | POA: Diagnosis not present

## 2015-11-25 DIAGNOSIS — M8580 Other specified disorders of bone density and structure, unspecified site: Secondary | ICD-10-CM | POA: Diagnosis not present

## 2016-01-06 DIAGNOSIS — G43009 Migraine without aura, not intractable, without status migrainosus: Secondary | ICD-10-CM | POA: Diagnosis not present

## 2016-01-30 ENCOUNTER — Encounter (HOSPITAL_COMMUNITY): Payer: Self-pay | Admitting: Emergency Medicine

## 2016-01-30 ENCOUNTER — Emergency Department (HOSPITAL_COMMUNITY)
Admission: EM | Admit: 2016-01-30 | Discharge: 2016-01-30 | Disposition: A | Discharge status: Home / Self Care | Payer: Medicare Other | Attending: Emergency Medicine | Admitting: Emergency Medicine

## 2016-01-30 ENCOUNTER — Emergency Department (HOSPITAL_COMMUNITY): Payer: Medicare Other

## 2016-01-30 DIAGNOSIS — Y999 Unspecified external cause status: Secondary | ICD-10-CM | POA: Insufficient documentation

## 2016-01-30 DIAGNOSIS — S0101XA Laceration without foreign body of scalp, initial encounter: Secondary | ICD-10-CM | POA: Diagnosis not present

## 2016-01-30 DIAGNOSIS — W19XXXA Unspecified fall, initial encounter: Secondary | ICD-10-CM

## 2016-01-30 DIAGNOSIS — Z7982 Long term (current) use of aspirin: Secondary | ICD-10-CM | POA: Insufficient documentation

## 2016-01-30 DIAGNOSIS — Y92513 Shop (commercial) as the place of occurrence of the external cause: Secondary | ICD-10-CM | POA: Insufficient documentation

## 2016-01-30 DIAGNOSIS — S8011XA Contusion of right lower leg, initial encounter: Secondary | ICD-10-CM | POA: Insufficient documentation

## 2016-01-30 DIAGNOSIS — S8001XA Contusion of right knee, initial encounter: Secondary | ICD-10-CM | POA: Diagnosis not present

## 2016-01-30 DIAGNOSIS — S01111A Laceration without foreign body of right eyelid and periocular area, initial encounter: Secondary | ICD-10-CM | POA: Diagnosis not present

## 2016-01-30 DIAGNOSIS — S0990XA Unspecified injury of head, initial encounter: Secondary | ICD-10-CM | POA: Diagnosis not present

## 2016-01-30 DIAGNOSIS — S0181XA Laceration without foreign body of other part of head, initial encounter: Secondary | ICD-10-CM

## 2016-01-30 DIAGNOSIS — W01110A Fall on same level from slipping, tripping and stumbling with subsequent striking against sharp glass, initial encounter: Secondary | ICD-10-CM | POA: Insufficient documentation

## 2016-01-30 DIAGNOSIS — Y939 Activity, unspecified: Secondary | ICD-10-CM | POA: Diagnosis not present

## 2016-01-30 DIAGNOSIS — S8992XA Unspecified injury of left lower leg, initial encounter: Secondary | ICD-10-CM | POA: Diagnosis not present

## 2016-01-30 DIAGNOSIS — Z87891 Personal history of nicotine dependence: Secondary | ICD-10-CM | POA: Insufficient documentation

## 2016-01-30 DIAGNOSIS — I10 Essential (primary) hypertension: Secondary | ICD-10-CM | POA: Insufficient documentation

## 2016-01-30 DIAGNOSIS — S8002XA Contusion of left knee, initial encounter: Secondary | ICD-10-CM | POA: Diagnosis not present

## 2016-01-30 DIAGNOSIS — Z79899 Other long term (current) drug therapy: Secondary | ICD-10-CM | POA: Insufficient documentation

## 2016-01-30 DIAGNOSIS — M25562 Pain in left knee: Secondary | ICD-10-CM | POA: Diagnosis not present

## 2016-01-30 MED ORDER — LIDOCAINE-EPINEPHRINE (PF) 2 %-1:200000 IJ SOLN
10.0000 mL | Freq: Once | INTRAMUSCULAR | Status: AC
  Administered 2016-01-30: 10 mL
  Filled 2016-01-30: qty 20

## 2016-01-30 MED ORDER — ACETAMINOPHEN 500 MG PO TABS: 1000.0000 mg | ORAL_TABLET | Freq: Four times a day (QID) | ORAL | 0 refills | Status: AC | PRN

## 2016-01-30 MED ORDER — ACETAMINOPHEN 500 MG PO TABS
1000.0000 mg | ORAL_TABLET | Freq: Once | ORAL | Status: DC
Start: 2016-01-30 — End: 2016-01-31

## 2016-01-30 NOTE — ED Provider Notes (Signed)
[facility] DEPT Provider Note   CSN: [id number] Arrival date & time: 01/30/16  1553     History   Chief Complaint Chief Complaint  Patient presents with  . Fall  . Facial Laceration  . Knee Pain    HPI Tracy [name] is a 80 y.o. female.  HPI Patient is a shopping and carrying a pillow should find purchase when she was tripped by a small broken portion of a hanger on the floor. It caused her to slip and fall forward. She fell directly onto her face wearing her glasses. Her glasses broke and cut her face around her right eye. Her daughter was with her. She does not think she was knocked out but she was dazed. She has not had any nausea or vomiting. Patient also has pain in her left knee. She however was ambulatory and weightbearing. She denies any focal weakness numbness or tingling. She denies any upper extremity injury. Past Medical History:  Diagnosis Date  . Edema of leg    right leg  . Hyperlipidemia   . Hypertension   . Migraine headache   . Mitral valve prolapse   . Mitral valve regurgitation   . Osteopenia   . Persistent atrial fibrillation ([place])   . S/P mitral valve replacement    09/24/2008    Patient Active Problem List   Diagnosis Date Noted  . Edema 08/23/2011  . Persistent atrial fibrillation ([place])   . Mitral valve regurgitation   . Hyperlipidemia   . Hypertension   . S/P mitral valve replacement     Past Surgical History:  Procedure Laterality Date  . ABDOMINAL HYSTERECTOMY  1978   partial  . BLADDER SUSPENSION    . COX-MAZE MICROWAVE ABLATION  09/24/2008   complete biatrial lesion set  . ENDOVENOUS ABLATION SAPHENOUS VEIN W/ LASER  10-12-2010   right greater saphenous vein Elza Rafter MD  . MITRAL VALVE REPLACEMENT  0825/2010   70mm Medtronic Mosaic bioprosthetic tissue valve    OB History    No data available       Home Medications    Prior to Admission medications   Medication Sig Start Date End Date Taking? Authorizing  Provider  aspirin 81 MG tablet Take 81 mg by mouth daily.     Yes Historical Provider, MD  Calcium 1200-1000 MG-UNIT CHEW Chew by mouth daily.   Yes Historical Provider, MD  furosemide (LASIX) 40 MG tablet Take 40 mg by mouth daily.    Yes Historical Provider, MD  gabapentin (NEURONTIN) 600 MG tablet Take 600 mg by mouth 2 (two) times daily.     Yes Historical Provider, MD  metoprolol succinate (TOPROL-XL) 25 MG 24 hr tablet Take 25 mg by mouth daily.     Yes Historical Provider, MD  Multiple Vitamin (MULTIVITAMIN) tablet Take 1 tablet by mouth daily.     Yes Historical Provider, MD  potassium chloride SA (K-DUR,KLOR-CON) 20 MEQ tablet Take 20 mEq by mouth daily.     Yes Historical Provider, MD  rosuvastatin (CRESTOR) 10 MG tablet Take 10 mg by mouth daily. 11/27/15  Yes Historical Provider, MD  SUMAtriptan Succinate (IMITREX PO) Take 1 tablet by mouth daily as needed (MIGRAINES).    Yes Historical Provider, MD  topiramate (TOPAMAX) 100 MG tablet Take 200 mg by mouth daily.    Yes Historical Provider, MD  trimethoprim (TRIMPEX) 100 MG tablet Take 100 mg by mouth daily.    Yes Historical Provider, MD  acetaminophen (TYLENOL) 500 MG  tablet Take 2 tablets (1,000 mg total) by mouth every 6 (six) hours as needed. 01/30/16   Charlesetta Shanks, MD    Family History Family History  Problem Relation Age of Onset  . Heart disease Father   . Stroke Maternal Grandmother   . Diabetes Neg Hx   . Heart attack Neg Hx     Social History Social History  Substance Use Topics  . Smoking status: Former Smoker    Types: Cigarettes    Quit date: 02/01/1996  . Smokeless tobacco: Not on file  . Alcohol use Yes     Comment: 1-2 drinks  weekly     Allergies   Ciprofloxacin; Augmentin [amoxicillin-pot clavulanate]; Bacitracin-neomycin-polymyxin; Macrobid [nitrofurantoin macrocrystal]; Monosodium glutamate; Sulfa antibiotics; and Neosporin [neomycin-bacitracin zn-polymyx]   Review of Systems Review of  Systems 10 Systems reviewed and are negative for acute change except as noted in the HPI.   Physical Exam Updated Vital Signs BP 137/82   Pulse 72   Temp 97.5 F (36.4 C) (Oral)   Resp 20   Ht 5\' 8"  (1.727 m)   Wt 153 lb (69.4 kg)   SpO2 100%   BMI 23.26 kg/m   Physical Exam  Constitutional: She is oriented to person, place, and time. She appears well-developed and well-nourished. No distress.  HENT:  Patient has 2 cm laceration in the right brow. Moderate gaping. Deep abrasion over the zygoma on the right. 1 cm. Diffuse ecchymoses over the zygoma on the right just lateral and inferior to the eye. Both nares are patent with no bleeding. Oral cavity normal with no evidence of dental injury.  Eyes: Conjunctivae and EOM are normal. Pupils are equal, round, and reactive to light.  Neck: Neck supple.  No C-spine tenderness.  Cardiovascular: Normal rate and regular rhythm.   No murmur heard. Pulmonary/Chest: Effort normal and breath sounds normal. No respiratory distress. She exhibits no tenderness.  Abdominal: Soft. There is no tenderness.  Musculoskeletal: She exhibits no edema or deformity.  Mild tenderness just superior to the left knee. Normal range of motion. Normal range of motion 4 extremities.  Neurological: She is alert and oriented to person, place, and time. No cranial nerve deficit. She exhibits normal muscle tone. Coordination normal.  Skin: Skin is warm and dry.  Psychiatric: She has a normal mood and affect.  Nursing note and vitals reviewed.    ED Treatments / Results  Labs (all labs ordered are listed, but only abnormal results are displayed) Labs Reviewed - No data to display  EKG  EKG Interpretation None       Radiology Ct Head Wo Contrast  Result Date: 01/30/2016 CLINICAL DATA:  Head injury.  Fall while shopping, lacerations. EXAM: CT HEAD WITHOUT CONTRAST TECHNIQUE: Contiguous axial images were obtained from the base of the skull through the  vertex without intravenous contrast. COMPARISON:  Brain MRI 02/12/2010 FINDINGS: Brain: No evidence of acute infarction, hemorrhage, hydrocephalus, extra-axial collection or mass lesion/mass effect. Generalized atrophy and chronic small vessel ischemia, stable from prior MRI allowing for differences in modality. Vascular: No hyperdense vessel or unexpected calcification. Skull: Normal. Negative for fracture or focal lesion. Sinuses/Orbits: No acute finding. Frontal sinuses are hypo pneumatized. Mild edema in the right supraorbital region with tiny focus of air likely laceration. No confluent hematoma. Other: None. IMPRESSION: No acute intracranial abnormality or fracture. Electronically Signed   By: Jeb Levering M.D.   On: 01/30/2016 21:34   Dg Knee Complete 4 Views Left  Result Date:  01/30/2016 CLINICAL DATA:  Status post fall today complaining of pain superior to the patella. EXAM: LEFT KNEE - COMPLETE 4+ VIEW COMPARISON:  None. FINDINGS: No evidence of fracture, dislocation, or joint effusion. There is decreased femoral tibial joint space. Soft tissues are unremarkable. IMPRESSION: No acute fracture or dislocation. Electronically Signed   By: Abelardo Diesel M.D.   On: 01/30/2016 16:47    Procedures .Marland KitchenLaceration Repair Date/Time: 01/30/2016 9:54 PM Performed by: Charlesetta Shanks Authorized by: Charlesetta Shanks   Consent:    Consent obtained:  Verbal   Consent given by:  Patient Anesthesia (see MAR for exact dosages):    Anesthesia method:  Local infiltration   Local anesthetic:  Lidocaine 2% WITH epi Laceration details:    Location:  Face   Face location:  R eyebrow   Length (cm):  2   Depth (mm):  3 Repair type:    Repair type:  Simple Pre-procedure details:    Preparation:  Patient was prepped and draped in usual sterile fashion Treatment:    Area cleansed with:  Saline and Hibiclens   Amount of cleaning:  Standard Skin repair:    Repair method:  Sutures   Suture size:  5-0    Suture technique:  Simple interrupted   Number of sutures:  7 Approximation:    Approximation:  Close   Vermilion border: well-aligned   Post-procedure details:    Dressing:  Adhesive bandage   Patient tolerance of procedure:  Tolerated well, no immediate complications Comments:     Vicryl Rapide used for wound closure.   (including critical care time)  Medications Ordered in ED Medications  acetaminophen (TYLENOL) tablet 1,000 mg (1,000 mg Oral Refused 01/30/16 2157)  lidocaine-EPINEPHrine (XYLOCAINE W/EPI) 2 %-1:200000 (PF) injection 10 mL (10 mLs Infiltration Given 01/30/16 2046)     Initial Impression / Assessment and Plan / ED Course  I have reviewed the triage vital signs and the nursing notes.  Pertinent labs & imaging results that were available during my care of the patient were reviewed by me and considered in my medical decision making (see chart for details).  Clinical Course       Final Clinical Impressions(s) / ED Diagnoses   Final diagnoses:  Fall, initial encounter  Facial laceration, initial encounter  Injury of head, initial encounter  Contusion of knee and lower leg, right, initial encounter   Patient is counseled on wound care and head injury instruction precautions provided. Patient is here with her daughter who will be assisting the patient. She has normal neurologic examination. Normal motor function without evidence of extremity fracture. Patient has been ambulatory with gait. New Prescriptions New Prescriptions   ACETAMINOPHEN (TYLENOL) 500 MG TABLET    Take 2 tablets (1,000 mg total) by mouth every 6 (six) hours as needed.     Charlesetta Shanks, MD 01/30/16 2159

## 2016-01-30 NOTE — ED Notes (Signed)
Patient transported to CT 

## 2016-01-30 NOTE — ED Triage Notes (Signed)
Patient was at [facility] shopping when another customer was trying to pass her when patient slid on piece of broken hanger causing her to fall face forward. Patient was wearing glasses and has laceration above right eyebrow.  Patient denies LOC. Patient takes baby AS daily.  Patient also c/o left knee pain

## 2016-02-02 DIAGNOSIS — S8002XA Contusion of left knee, initial encounter: Secondary | ICD-10-CM | POA: Diagnosis not present

## 2016-02-02 DIAGNOSIS — S0181XD Laceration without foreign body of other part of head, subsequent encounter: Secondary | ICD-10-CM | POA: Diagnosis not present

## 2016-02-02 DIAGNOSIS — S8001XA Contusion of right knee, initial encounter: Secondary | ICD-10-CM | POA: Diagnosis not present

## 2016-02-08 DIAGNOSIS — N3020 Other chronic cystitis without hematuria: Secondary | ICD-10-CM | POA: Diagnosis not present

## 2016-02-08 DIAGNOSIS — R350 Frequency of micturition: Secondary | ICD-10-CM | POA: Diagnosis not present

## 2016-02-21 NOTE — Progress Notes (Signed)
Patient ID: Tracy [name], female   DOB: 1935-12-12, 80 y.o.   MRN: [id number]     Cardiology Office Note   Date:  02/22/2016   [name], DOB 03-23-1935, MRN [id number]  PCP:  Reginia Naas, MD    Chief Complaint  Patient presents with  . Mitral valve insufficiency, unspecified etiology  f/u MV replacement in 2010   Wt Readings from Last 3 Encounters:  02/22/16 153 lb (69.4 kg)  01/30/16 153 lb (69.4 kg)  02/19/15 153 lb (69.4 kg)       History of Present Illness: Tracy [name] is a 81 y.o. female  who had MVR in 8/10. She had some persistent right leg swelling. SHe ultimately had some vein injection done. SHe is wearing compression hose. It was thought to be due to some lymph nodes at the right hip per her report.  In warmer weather, She walked several times a week. She lifts light weights as well. She uses a rowing machine if the weather is bad. No cardiac sx at this time. No sx like her AFib.          She went to [place] for a couple of months in February 2017.  No palpitations.  SHe plans to ride a bike and play golf.   She takes antibiotics when she goes to the dentist.    She had a fall in December 2017 and required stitches above the right eye.  Head CT was ok.  Knee xrays were ok.  She is still recovering.  She is trying to stay active in the cold weather.   She is going out [place] in a few months.  SHe is going to [place] in April.         Past Medical History:  Diagnosis Date  . Edema of leg    right leg  . Hyperlipidemia   . Hypertension   . Migraine headache   . Mitral valve prolapse   . Mitral valve regurgitation   . Osteopenia   . Persistent atrial fibrillation ([place])   . S/P mitral valve replacement    09/24/2008    Past Surgical History:  Procedure Laterality Date  . ABDOMINAL HYSTERECTOMY  1978   partial  . BLADDER SUSPENSION    . COX-MAZE MICROWAVE ABLATION  09/24/2008   complete biatrial lesion set  .  ENDOVENOUS ABLATION SAPHENOUS VEIN W/ LASER  10-12-2010   right greater saphenous vein Elza Rafter MD  . MITRAL VALVE REPLACEMENT  0825/2010   16mm Medtronic Mosaic bioprosthetic tissue valve     Current Outpatient Prescriptions  Medication Sig Dispense Refill  . acetaminophen (TYLENOL) 500 MG tablet Take 2 tablets (1,000 mg total) by mouth every 6 (six) hours as needed. 30 tablet 0  . aspirin 81 MG tablet Take 81 mg by mouth daily.      . Calcium 1200-1000 MG-UNIT CHEW Chew by mouth daily.    . furosemide (LASIX) 40 MG tablet Take 40 mg by mouth daily.     Marland Kitchen gabapentin (NEURONTIN) 600 MG tablet Take 600 mg by mouth 2 (two) times daily.      . metoprolol succinate (TOPROL-XL) 25 MG 24 hr tablet Take 25 mg by mouth daily.      . Multiple Vitamin (MULTIVITAMIN) tablet Take 1 tablet by mouth daily.      . potassium chloride SA (K-DUR,KLOR-CON) 20 MEQ tablet Take 20 mEq by mouth daily.      . rosuvastatin (CRESTOR) 10  MG tablet Take 10 mg by mouth daily.    . SUMAtriptan Succinate (IMITREX PO) Take 1 tablet by mouth daily as needed (MIGRAINES).     Marland Kitchen topiramate (TOPAMAX) 100 MG tablet Take 200 mg by mouth daily.     Marland Kitchen trimethoprim (TRIMPEX) 100 MG tablet Take 100 mg by mouth daily.      No current facility-administered medications for this visit.     Allergies:   Ciprofloxacin; Augmentin [amoxicillin-pot clavulanate]; Bacitracin-neomycin-polymyxin; Macrobid [nitrofurantoin macrocrystal]; Monosodium glutamate; Sulfa antibiotics; and Neosporin [neomycin-bacitracin zn-polymyx]    Social History:  The patient  reports that she quit smoking about 20 years ago. Her smoking use included Cigarettes. She does not have any smokeless tobacco history on file. She reports that she drinks alcohol. She reports that she does not use drugs.   Family History:  The patient's family history includes Heart disease in her father; Stroke in her maternal grandmother.    ROS:  Please see the history of  present illness.   Otherwise, review of systems are positive for right leg swelling-chronic.   All other systems are reviewed and negative.    PHYSICAL EXAM: VS:  BP 120/70   Pulse 78   Ht 5\' 9"  (1.753 m)   Wt 153 lb (69.4 kg)   SpO2 98%   BMI 22.59 kg/m  , BMI Body mass index is 22.59 kg/m. GEN: Well nourished, well developed, in no acute distress  HEENT: normal  Neck: no JVD, carotid bruits, or masses Cardiac: RRR; no murmurs, rubs, or gallops, mild right leg edema  Respiratory:  clear to auscultation bilaterally, normal work of breathing GI: soft, nontender, nondistended, + BS MS: no deformity or atrophy  Skin: warm and dry, no rash Neuro:  Strength and sensation are intact Psych: euthymic mood, full affect   EKG:   The ekg ordered today demonstrates NSR, PACs   Recent Labs: No results found for requested labs within last 8760 hours.   Lipid Panel No results found for: CHOL, TRIG, HDL, CHOLHDL, VLDL, LDLCALC, LDLDIRECT   Other studies Reviewed: Additional studies/ records that were reviewed today with results demonstrating: Normal EF in 8/12. Normal vlavular function.    ASSESSMENT AND PLAN:  1. Paroxysmal AFib: In NSR now. No need for coumadin from a cardiac standpoint. S/p Maze at time of valve replacement in 2010. Continue Toprol.  No sx and valve sounds like it is functioning well.  Did not order echo.  2.  MV replacement:  Continue SBE prophylaxis with dental procedures. LV and RV function along with valvular function were normal in 2012.  Exam is unremarkable. Right leg Edema does not appear to be from a cardiac source. Refill amoxicillin.  She has been tolerating amoxicillin despite her allergy to Augmentin.           3. Hyperlipidemia: Welchol.  Intolerant of lipitor in the past due to myalgia in the leg.  TC was 201 at last check with her Dr. Carol Ada.  Could consider rosuvastatin if LDL increases.  Welchol is quite expensive for her.  Check if  rosuvastatin would be cheaper with her pharmacy, based on her insurance.  4. Chronic right leg edema.  May be lymphedema related.  Encouraged her to walk on her plane trips to prevent DVT.     Current medicines are reviewed at length with the patient today.  The patient concerns regarding her medicines were addressed.  The following changes have been made:  No change- refill amoxicillin  Labs/ tests ordered today include:   Orders Placed This Encounter  Procedures  . EKG 12-Lead    Recommend 150 minutes/week of aerobic exercise Low fat, low carb, high fiber diet recommended  Disposition:   FU in  1 year   Signed, Larae Grooms, MD  02/22/2016 2:27 PM    [facility] [place], [place], [place]  [address] Phone: [phone]; Fax: [phone]

## 2016-02-22 ENCOUNTER — Encounter: Payer: Self-pay | Admitting: Interventional Cardiology

## 2016-02-22 ENCOUNTER — Ambulatory Visit (INDEPENDENT_AMBULATORY_CARE_PROVIDER_SITE_OTHER): Payer: Medicare Other | Admitting: Interventional Cardiology

## 2016-02-22 VITALS — BP 120/70 | HR 78 | Ht 69.0 in | Wt 153.0 lb

## 2016-02-22 DIAGNOSIS — I340 Nonrheumatic mitral (valve) insufficiency: Secondary | ICD-10-CM | POA: Diagnosis not present

## 2016-02-22 DIAGNOSIS — I480 Paroxysmal atrial fibrillation: Secondary | ICD-10-CM

## 2016-02-22 DIAGNOSIS — E782 Mixed hyperlipidemia: Secondary | ICD-10-CM | POA: Diagnosis not present

## 2016-02-22 DIAGNOSIS — R600 Localized edema: Secondary | ICD-10-CM

## 2016-02-22 NOTE — Patient Instructions (Signed)
**Note De-identified Jaelin Devincentis Obfuscation** Medication Instructions:  Same-no changes  Labwork: None  Testing/Procedures: None  Follow-Up: Your physician wants you to follow-up in: 1 year. You will receive a reminder letter in the mail two months in advance. If you don't receive a letter, please call our office to schedule the follow-up appointment.      If you need a refill on your cardiac medications before your next appointment, please call your pharmacy.   

## 2016-04-04 DIAGNOSIS — N3090 Cystitis, unspecified without hematuria: Secondary | ICD-10-CM | POA: Diagnosis not present

## 2016-04-04 DIAGNOSIS — R300 Dysuria: Secondary | ICD-10-CM | POA: Diagnosis not present

## 2016-06-14 DIAGNOSIS — R079 Chest pain, unspecified: Secondary | ICD-10-CM | POA: Diagnosis not present

## 2016-06-14 DIAGNOSIS — E785 Hyperlipidemia, unspecified: Secondary | ICD-10-CM | POA: Diagnosis not present

## 2016-06-14 DIAGNOSIS — Z7982 Long term (current) use of aspirin: Secondary | ICD-10-CM | POA: Diagnosis not present

## 2016-06-14 DIAGNOSIS — Z888 Allergy status to other drugs, medicaments and biological substances status: Secondary | ICD-10-CM | POA: Diagnosis not present

## 2016-06-14 DIAGNOSIS — Z79899 Other long term (current) drug therapy: Secondary | ICD-10-CM | POA: Diagnosis not present

## 2016-06-14 DIAGNOSIS — J189 Pneumonia, unspecified organism: Secondary | ICD-10-CM | POA: Diagnosis not present

## 2016-06-14 DIAGNOSIS — R0989 Other specified symptoms and signs involving the circulatory and respiratory systems: Secondary | ICD-10-CM | POA: Diagnosis not present

## 2016-06-14 DIAGNOSIS — E7800 Pure hypercholesterolemia, unspecified: Secondary | ICD-10-CM | POA: Diagnosis not present

## 2016-06-14 DIAGNOSIS — R0602 Shortness of breath: Secondary | ICD-10-CM | POA: Diagnosis not present

## 2016-06-14 DIAGNOSIS — Z954 Presence of other heart-valve replacement: Secondary | ICD-10-CM | POA: Diagnosis not present

## 2016-06-14 DIAGNOSIS — R05 Cough: Secondary | ICD-10-CM | POA: Diagnosis not present

## 2016-06-14 DIAGNOSIS — R0600 Dyspnea, unspecified: Secondary | ICD-10-CM | POA: Diagnosis not present

## 2016-06-14 DIAGNOSIS — Z880 Allergy status to penicillin: Secondary | ICD-10-CM | POA: Diagnosis not present

## 2016-06-24 ENCOUNTER — Other Ambulatory Visit: Payer: Self-pay | Admitting: Family Medicine

## 2016-06-24 DIAGNOSIS — J181 Lobar pneumonia, unspecified organism: Secondary | ICD-10-CM | POA: Diagnosis not present

## 2016-06-24 DIAGNOSIS — J189 Pneumonia, unspecified organism: Secondary | ICD-10-CM

## 2016-06-24 DIAGNOSIS — Z1231 Encounter for screening mammogram for malignant neoplasm of breast: Secondary | ICD-10-CM | POA: Diagnosis not present

## 2016-06-29 DIAGNOSIS — Z1389 Encounter for screening for other disorder: Secondary | ICD-10-CM | POA: Diagnosis not present

## 2016-06-29 DIAGNOSIS — I480 Paroxysmal atrial fibrillation: Secondary | ICD-10-CM | POA: Diagnosis not present

## 2016-06-29 DIAGNOSIS — E782 Mixed hyperlipidemia: Secondary | ICD-10-CM | POA: Diagnosis not present

## 2016-06-29 DIAGNOSIS — Z0000 Encounter for general adult medical examination without abnormal findings: Secondary | ICD-10-CM | POA: Diagnosis not present

## 2016-06-29 DIAGNOSIS — Z952 Presence of prosthetic heart valve: Secondary | ICD-10-CM | POA: Diagnosis not present

## 2016-06-29 DIAGNOSIS — Z1211 Encounter for screening for malignant neoplasm of colon: Secondary | ICD-10-CM | POA: Diagnosis not present

## 2016-06-29 DIAGNOSIS — I10 Essential (primary) hypertension: Secondary | ICD-10-CM | POA: Diagnosis not present

## 2016-06-29 DIAGNOSIS — R609 Edema, unspecified: Secondary | ICD-10-CM | POA: Diagnosis not present

## 2016-07-11 DIAGNOSIS — G43009 Migraine without aura, not intractable, without status migrainosus: Secondary | ICD-10-CM | POA: Diagnosis not present

## 2016-07-28 ENCOUNTER — Other Ambulatory Visit: Payer: 59

## 2016-07-28 ENCOUNTER — Ambulatory Visit
Admission: RE | Admit: 2016-07-28 | Discharge: 2016-07-28 | Disposition: A | Discharge status: Home / Self Care | Payer: Medicare Other | Source: Ambulatory Visit | Attending: Family Medicine | Admitting: Family Medicine

## 2016-07-28 DIAGNOSIS — J181 Lobar pneumonia, unspecified organism: Principal | ICD-10-CM

## 2016-07-28 DIAGNOSIS — J189 Pneumonia, unspecified organism: Secondary | ICD-10-CM | POA: Diagnosis not present

## 2016-07-29 DIAGNOSIS — Z1211 Encounter for screening for malignant neoplasm of colon: Secondary | ICD-10-CM | POA: Diagnosis not present

## 2016-08-23 DIAGNOSIS — R197 Diarrhea, unspecified: Secondary | ICD-10-CM | POA: Diagnosis not present

## 2016-08-23 DIAGNOSIS — S30860A Insect bite (nonvenomous) of lower back and pelvis, initial encounter: Secondary | ICD-10-CM | POA: Diagnosis not present

## 2016-08-30 DIAGNOSIS — R197 Diarrhea, unspecified: Secondary | ICD-10-CM | POA: Diagnosis not present

## 2016-09-26 DIAGNOSIS — R300 Dysuria: Secondary | ICD-10-CM | POA: Diagnosis not present

## 2016-09-26 DIAGNOSIS — N3001 Acute cystitis with hematuria: Secondary | ICD-10-CM | POA: Diagnosis not present

## 2016-11-09 DIAGNOSIS — Z23 Encounter for immunization: Secondary | ICD-10-CM | POA: Diagnosis not present

## 2016-12-15 DIAGNOSIS — R609 Edema, unspecified: Secondary | ICD-10-CM | POA: Diagnosis not present

## 2016-12-15 DIAGNOSIS — N3001 Acute cystitis with hematuria: Secondary | ICD-10-CM | POA: Diagnosis not present

## 2016-12-15 DIAGNOSIS — R300 Dysuria: Secondary | ICD-10-CM | POA: Diagnosis not present

## 2017-01-10 DIAGNOSIS — G43009 Migraine without aura, not intractable, without status migrainosus: Secondary | ICD-10-CM | POA: Diagnosis not present

## 2017-01-12 DIAGNOSIS — I10 Essential (primary) hypertension: Secondary | ICD-10-CM | POA: Diagnosis not present

## 2017-01-12 DIAGNOSIS — R300 Dysuria: Secondary | ICD-10-CM | POA: Diagnosis not present

## 2017-01-12 DIAGNOSIS — E782 Mixed hyperlipidemia: Secondary | ICD-10-CM | POA: Diagnosis not present

## 2017-01-12 DIAGNOSIS — I480 Paroxysmal atrial fibrillation: Secondary | ICD-10-CM | POA: Diagnosis not present

## 2017-01-12 DIAGNOSIS — M8580 Other specified disorders of bone density and structure, unspecified site: Secondary | ICD-10-CM | POA: Diagnosis not present

## 2017-01-12 DIAGNOSIS — N183 Chronic kidney disease, stage 3 (moderate): Secondary | ICD-10-CM | POA: Diagnosis not present

## 2017-01-12 DIAGNOSIS — Z952 Presence of prosthetic heart valve: Secondary | ICD-10-CM | POA: Diagnosis not present

## 2017-01-12 DIAGNOSIS — N3000 Acute cystitis without hematuria: Secondary | ICD-10-CM | POA: Diagnosis not present

## 2017-02-10 DIAGNOSIS — N3020 Other chronic cystitis without hematuria: Secondary | ICD-10-CM | POA: Diagnosis not present

## 2017-02-10 DIAGNOSIS — R350 Frequency of micturition: Secondary | ICD-10-CM | POA: Diagnosis not present

## 2017-02-14 DIAGNOSIS — I10 Essential (primary) hypertension: Secondary | ICD-10-CM | POA: Diagnosis not present

## 2017-02-14 DIAGNOSIS — E782 Mixed hyperlipidemia: Secondary | ICD-10-CM | POA: Diagnosis not present

## 2017-02-15 DIAGNOSIS — H6123 Impacted cerumen, bilateral: Secondary | ICD-10-CM | POA: Diagnosis not present

## 2017-02-15 NOTE — Progress Notes (Signed)
Cardiology Office Note   Date:  02/16/2017   ID:  Tracy [name], DOB Jan 21, 1936, MRN [id number]  PCP:  Carol Ada, MD    No chief complaint on file.  F/u mitral valve disorder  Wt Readings from Last 3 Encounters:  02/16/17 154 lb (69.9 kg)  02/22/16 153 lb (69.4 kg)  01/30/16 153 lb (69.4 kg)       History of Present Illness: Tracy [name] is a 82 y.o. female   who had MVR in 8/10. No CAD at that time.   She had some persistent right leg swelling. SHe ultimately had some vein injection done. SHe is wearing compression hose. It was thought to be due to some lymph nodes at the right hip per her report.  Denies : Chest pain. Dizziness. Leg edema. Nitroglycerin use. Orthopnea. Palpitations. Paroxysmal nocturnal dyspnea. Shortness of breath. Syncope.   Her biggest concern is that her husband was diagnosed with dementia.  She is worried about handling the finances.    She does try to walk and lift light weights.    She is concerned about the longevity about her valve, especially with her husband's illness.    We discussed checking an echo at this time, but she would like to hold off.      Past Medical History:  Diagnosis Date  . Edema of leg    right leg  . Hyperlipidemia   . Hypertension   . Migraine headache   . Mitral valve prolapse   . Mitral valve regurgitation   . Osteopenia   . Persistent atrial fibrillation ([place])   . S/P mitral valve replacement    09/24/2008    Past Surgical History:  Procedure Laterality Date  . ABDOMINAL HYSTERECTOMY  1978   partial  . BLADDER SUSPENSION    . COX-MAZE MICROWAVE ABLATION  09/24/2008   complete biatrial lesion set  . ENDOVENOUS ABLATION SAPHENOUS VEIN W/ LASER  10-12-2010   right greater saphenous vein Elza Rafter MD  . MITRAL VALVE REPLACEMENT  0825/2010   34mm Medtronic Mosaic bioprosthetic tissue valve     Current Outpatient Medications  Medication Sig Dispense Refill  . acetaminophen  (TYLENOL) 500 MG tablet Take 2 tablets (1,000 mg total) by mouth every 6 (six) hours as needed. 30 tablet 0  . aspirin 81 MG tablet Take 81 mg by mouth daily.      . Calcium 1200-1000 MG-UNIT CHEW Chew by mouth daily.    . cephALEXin (KEFLEX) 250 MG capsule Take 250 mg by mouth daily.    . furosemide (LASIX) 40 MG tablet Take 40 mg by mouth daily.     Marland Kitchen gabapentin (NEURONTIN) 600 MG tablet Take 600 mg by mouth 2 (two) times daily.      . metoprolol succinate (TOPROL-XL) 25 MG 24 hr tablet Take 25 mg by mouth daily.      . Multiple Vitamin (MULTIVITAMIN) tablet Take 1 tablet by mouth daily.      . potassium chloride SA (K-DUR,KLOR-CON) 20 MEQ tablet Take 20 mEq by mouth daily.      . rosuvastatin (CRESTOR) 10 MG tablet Take 10 mg by mouth daily.    . SUMAtriptan Succinate (IMITREX PO) Take 1 tablet by mouth daily as needed (MIGRAINES).     Marland Kitchen topiramate (TOPAMAX) 100 MG tablet Take 200 mg by mouth daily.      No current facility-administered medications for this visit.     Allergies:   Bacitracin-neomycin-polymyxin; Ciprofloxacin; Nitrofurantoin macrocrystal; Sulfamethoxazole-trimethoprim;  Augmentin [amoxicillin-pot clavulanate]; Monosodium glutamate; Sulfa antibiotics; and Neosporin [neomycin-bacitracin zn-polymyx]    Social History:  The patient  reports that she quit smoking about 21 years ago. Her smoking use included cigarettes. she has never used smokeless tobacco. She reports that she drinks alcohol. She reports that she does not use drugs.   Family History:  The patient's family history includes Heart disease in her father; Stroke in her maternal grandmother.    ROS:  Please see the history of present illness.   Otherwise, review of systems are positive for stress with her husband;s dementia.   All other systems are reviewed and negative.    PHYSICAL EXAM: VS:  BP 122/68   Pulse 76   Ht 5\' 9"  (1.753 m)   Wt 154 lb (69.9 kg)   SpO2 97%   BMI 22.74 kg/m  , BMI Body mass index is  22.74 kg/m. GEN: Well nourished, well developed, in no acute distress  HEENT: normal  Neck: no JVD, carotid bruits, or masses Cardiac: RRR; no murmurs, rubs, or gallops,no edema  Respiratory:  clear to auscultation bilaterally, normal work of breathing GI: soft, nontender, nondistended, + BS MS: no deformity or atrophy  Skin: warm and dry, no rash Neuro:  Strength and sensation are intact Psych: euthymic mood, full affect   EKG:   The ekg ordered today demonstrates NSR, prolonged PR, no ST changes   Recent Labs: No results found for requested labs within last 8760 hours.   Lipid Panel No results found for: CHOL, TRIG, HDL, CHOLHDL, VLDL, LDLCALC, LDLDIRECT   Other studies Reviewed: Additional studies/ records that were reviewed today with results demonstrating: 2012 echo reviewed showing normal LV function and normal prosthetic mitral valve function.   ASSESSMENT AND PLAN:  1. PAF: No sx of AFib.  Continue metoprolol.  She has been off of anticoagulation for some time. 2. MV replacement: needs SBE prophylaxis.  We discussed repeat echo but she would like to wait until next year. 3. Hyperlipidemia: LDL72 in 5/18.  Continue Crestor.  4. Chronic right leg edema: Stable with compression stockings.   Current medicines are reviewed at length with the patient today.  The patient concerns regarding her medicines were addressed.  The following changes have been made:  No change  Labs/ tests ordered today include:  No orders of the defined types were placed in this encounter.   Recommend 150 minutes/week of aerobic exercise Low fat, low carb, high fiber diet recommended  Disposition:   FU in 1 year   Signed, Larae Grooms, MD  02/16/2017 2:15 PM    [facility] [place], [place], [place]  [address] Phone: [phone]; Fax: [phone]

## 2017-02-16 ENCOUNTER — Ambulatory Visit (INDEPENDENT_AMBULATORY_CARE_PROVIDER_SITE_OTHER): Payer: Medicare Other | Admitting: Interventional Cardiology

## 2017-02-16 ENCOUNTER — Encounter: Payer: Self-pay | Admitting: Interventional Cardiology

## 2017-02-16 VITALS — BP 122/68 | HR 76 | Ht 69.0 in | Wt 154.0 lb

## 2017-02-16 DIAGNOSIS — Z952 Presence of prosthetic heart valve: Secondary | ICD-10-CM

## 2017-02-16 DIAGNOSIS — E782 Mixed hyperlipidemia: Secondary | ICD-10-CM | POA: Diagnosis not present

## 2017-02-16 DIAGNOSIS — I480 Paroxysmal atrial fibrillation: Secondary | ICD-10-CM | POA: Diagnosis not present

## 2017-02-16 DIAGNOSIS — R600 Localized edema: Secondary | ICD-10-CM

## 2017-02-16 NOTE — Patient Instructions (Signed)

## 2017-02-20 ENCOUNTER — Other Ambulatory Visit: Payer: Self-pay | Admitting: *Deleted

## 2017-02-20 MED ORDER — AMOXICILLIN 500 MG PO CAPS: ORAL_CAPSULE | ORAL | 3 refills | Status: DC

## 2017-02-20 NOTE — Telephone Encounter (Signed)
Received a fax from [facility] stating that the patient needs a new rx for dentist appointment as dentist will no longer prescribe this. Please advise. Thanks, MI

## 2017-02-20 NOTE — Telephone Encounter (Signed)
Called and spoke to patient regarding amoxicillin refill request. Patient has allergy to Augmentin. Called to confimr patient able to take amoxicillin. Patient states that she has taken amoxicillin in the past with no issues. Patient does require SBE prophylaxis. Rx sent to preferred pharmacy for amoxicillin 2 grams 1 hour prior to dental procedure. Patient thanked me for the call.

## 2017-02-28 ENCOUNTER — Other Ambulatory Visit: Payer: Self-pay | Admitting: Family Medicine

## 2017-02-28 DIAGNOSIS — R911 Solitary pulmonary nodule: Secondary | ICD-10-CM

## 2017-03-01 ENCOUNTER — Ambulatory Visit (INDEPENDENT_AMBULATORY_CARE_PROVIDER_SITE_OTHER): Payer: Medicare Other | Admitting: Podiatry

## 2017-03-01 ENCOUNTER — Other Ambulatory Visit: Payer: Self-pay | Admitting: Podiatry

## 2017-03-01 ENCOUNTER — Ambulatory Visit (INDEPENDENT_AMBULATORY_CARE_PROVIDER_SITE_OTHER): Payer: Medicare Other

## 2017-03-01 ENCOUNTER — Encounter: Payer: Self-pay | Admitting: Podiatry

## 2017-03-01 DIAGNOSIS — M7750 Other enthesopathy of unspecified foot: Secondary | ICD-10-CM

## 2017-03-01 DIAGNOSIS — M779 Enthesopathy, unspecified: Secondary | ICD-10-CM | POA: Diagnosis not present

## 2017-03-01 DIAGNOSIS — M2042 Other hammer toe(s) (acquired), left foot: Secondary | ICD-10-CM

## 2017-03-01 DIAGNOSIS — L84 Corns and callosities: Secondary | ICD-10-CM

## 2017-03-01 MED ORDER — TRIAMCINOLONE ACETONIDE 10 MG/ML IJ SUSP
10.0000 mg | Freq: Once | INTRAMUSCULAR | Status: AC
  Administered 2017-03-01: 10 mg

## 2017-03-01 NOTE — Progress Notes (Signed)
   Subjective:    Patient ID: Tracy [name], female    DOB: 10/08/1935, 82 y.o.   MRN: [id number]  HPI    Review of Systems  All other systems reviewed and are negative.      Objective:   Physical Exam        Assessment & Plan:

## 2017-03-01 NOTE — Progress Notes (Signed)
Subjective:   Patient ID: Tracy [name], female   DOB: 82 y.o.   MRN: [id number]   HPI Patient points to left foot and states the fourth toe has really been bothering her with inflamed lesion that is painful when pressed making shoe gear difficult.  Patient states that she is tried padding it without relief and patient does not smoke and likes to be active   Review of Systems  All other systems reviewed and are negative.       Objective:  Physical Exam  Constitutional: She appears well-developed and well-nourished.  Cardiovascular: Intact distal pulses.  Pulmonary/Chest: Effort normal.  Musculoskeletal: Normal range of motion.  Neurological: She is alert.  Skin: Skin is warm.  Nursing note and vitals reviewed.   Neurovascular status found to be intact muscle strength was adequate range of motion within normal limits with patient found to have inflamed fourth digit left lateral side is painful when pressed with keratotic lesion formation.  It is local to this area with no other pathology noted     Assessment:  Hammertoe deformity fourth right with fluid buildup lesion that is painful with palpated with capsulitis     Plan:  H&P x-ray reviewed and today proximal nerve block administered fourth digit left and careful interphalangeal joint administered fourth toe 1 mg Dexasone 1 mg Kenalog debrided the lesion and applied padding.  Reappoint as symptoms indicate may require more aggressive treatment  X-rays indicate the fifth toe abuts against the fourth toe left

## 2017-03-06 ENCOUNTER — Other Ambulatory Visit: Payer: Medicare Other

## 2017-03-10 ENCOUNTER — Ambulatory Visit
Admission: RE | Admit: 2017-03-10 | Discharge: 2017-03-10 | Disposition: A | Discharge status: Home / Self Care | Payer: Medicare Other | Source: Ambulatory Visit | Attending: Family Medicine | Admitting: Family Medicine

## 2017-03-10 DIAGNOSIS — R911 Solitary pulmonary nodule: Secondary | ICD-10-CM

## 2017-04-25 DIAGNOSIS — M8580 Other specified disorders of bone density and structure, unspecified site: Secondary | ICD-10-CM | POA: Diagnosis not present

## 2017-04-25 DIAGNOSIS — S22000A Wedge compression fracture of unspecified thoracic vertebra, initial encounter for closed fracture: Secondary | ICD-10-CM | POA: Diagnosis not present

## 2017-04-25 DIAGNOSIS — M0579 Rheumatoid arthritis with rheumatoid factor of multiple sites without organ or systems involvement: Secondary | ICD-10-CM | POA: Diagnosis not present

## 2017-04-25 DIAGNOSIS — M549 Dorsalgia, unspecified: Secondary | ICD-10-CM | POA: Diagnosis not present

## 2017-04-25 DIAGNOSIS — M79643 Pain in unspecified hand: Secondary | ICD-10-CM | POA: Diagnosis not present

## 2017-04-25 DIAGNOSIS — G8929 Other chronic pain: Secondary | ICD-10-CM | POA: Diagnosis not present

## 2017-04-25 DIAGNOSIS — D72819 Decreased white blood cell count, unspecified: Secondary | ICD-10-CM | POA: Diagnosis not present

## 2017-05-11 DIAGNOSIS — M8589 Other specified disorders of bone density and structure, multiple sites: Secondary | ICD-10-CM | POA: Diagnosis not present

## 2017-06-06 DIAGNOSIS — M810 Age-related osteoporosis without current pathological fracture: Secondary | ICD-10-CM | POA: Diagnosis not present

## 2017-06-27 DIAGNOSIS — N3020 Other chronic cystitis without hematuria: Secondary | ICD-10-CM | POA: Diagnosis not present

## 2017-07-03 DIAGNOSIS — Z1389 Encounter for screening for other disorder: Secondary | ICD-10-CM | POA: Diagnosis not present

## 2017-07-03 DIAGNOSIS — Z0000 Encounter for general adult medical examination without abnormal findings: Secondary | ICD-10-CM | POA: Diagnosis not present

## 2017-07-03 DIAGNOSIS — N183 Chronic kidney disease, stage 3 (moderate): Secondary | ICD-10-CM | POA: Diagnosis not present

## 2017-07-03 DIAGNOSIS — M8580 Other specified disorders of bone density and structure, unspecified site: Secondary | ICD-10-CM | POA: Diagnosis not present

## 2017-07-03 DIAGNOSIS — I10 Essential (primary) hypertension: Secondary | ICD-10-CM | POA: Diagnosis not present

## 2017-07-03 DIAGNOSIS — E782 Mixed hyperlipidemia: Secondary | ICD-10-CM | POA: Diagnosis not present

## 2017-07-03 DIAGNOSIS — I700 Atherosclerosis of aorta: Secondary | ICD-10-CM | POA: Diagnosis not present

## 2017-07-03 DIAGNOSIS — I480 Paroxysmal atrial fibrillation: Secondary | ICD-10-CM | POA: Diagnosis not present

## 2017-07-03 DIAGNOSIS — G43909 Migraine, unspecified, not intractable, without status migrainosus: Secondary | ICD-10-CM | POA: Diagnosis not present

## 2017-07-03 DIAGNOSIS — Z952 Presence of prosthetic heart valve: Secondary | ICD-10-CM | POA: Diagnosis not present

## 2017-07-11 DIAGNOSIS — G43009 Migraine without aura, not intractable, without status migrainosus: Secondary | ICD-10-CM | POA: Diagnosis not present

## 2017-08-08 DIAGNOSIS — S42022A Displaced fracture of shaft of left clavicle, initial encounter for closed fracture: Secondary | ICD-10-CM | POA: Diagnosis not present

## 2017-08-08 DIAGNOSIS — M25512 Pain in left shoulder: Secondary | ICD-10-CM | POA: Diagnosis not present

## 2017-08-10 DIAGNOSIS — R350 Frequency of micturition: Secondary | ICD-10-CM | POA: Diagnosis not present

## 2017-08-10 DIAGNOSIS — N3020 Other chronic cystitis without hematuria: Secondary | ICD-10-CM | POA: Diagnosis not present

## 2017-08-22 DIAGNOSIS — S42022D Displaced fracture of shaft of left clavicle, subsequent encounter for fracture with routine healing: Secondary | ICD-10-CM | POA: Diagnosis not present

## 2017-08-22 DIAGNOSIS — M25512 Pain in left shoulder: Secondary | ICD-10-CM | POA: Diagnosis not present

## 2017-08-28 DIAGNOSIS — R8290 Unspecified abnormal findings in urine: Secondary | ICD-10-CM | POA: Diagnosis not present

## 2017-08-28 DIAGNOSIS — R109 Unspecified abdominal pain: Secondary | ICD-10-CM | POA: Diagnosis not present

## 2017-08-28 DIAGNOSIS — R350 Frequency of micturition: Secondary | ICD-10-CM | POA: Diagnosis not present

## 2017-09-01 DIAGNOSIS — N3020 Other chronic cystitis without hematuria: Secondary | ICD-10-CM | POA: Diagnosis not present

## 2017-09-01 DIAGNOSIS — M549 Dorsalgia, unspecified: Secondary | ICD-10-CM | POA: Diagnosis not present

## 2017-09-01 DIAGNOSIS — N23 Unspecified renal colic: Secondary | ICD-10-CM | POA: Diagnosis not present

## 2017-09-01 DIAGNOSIS — R1084 Generalized abdominal pain: Secondary | ICD-10-CM | POA: Diagnosis not present

## 2017-09-07 DIAGNOSIS — N3020 Other chronic cystitis without hematuria: Secondary | ICD-10-CM | POA: Diagnosis not present

## 2017-09-07 DIAGNOSIS — B373 Candidiasis of vulva and vagina: Secondary | ICD-10-CM | POA: Diagnosis not present

## 2017-10-03 DIAGNOSIS — S42022D Displaced fracture of shaft of left clavicle, subsequent encounter for fracture with routine healing: Secondary | ICD-10-CM | POA: Diagnosis not present

## 2017-10-03 DIAGNOSIS — M25512 Pain in left shoulder: Secondary | ICD-10-CM | POA: Diagnosis not present

## 2017-10-17 DIAGNOSIS — K1329 Other disturbances of oral epithelium, including tongue: Secondary | ICD-10-CM | POA: Diagnosis not present

## 2017-11-01 DIAGNOSIS — K1321 Leukoplakia of oral mucosa, including tongue: Secondary | ICD-10-CM | POA: Diagnosis not present

## 2017-11-07 DIAGNOSIS — Z23 Encounter for immunization: Secondary | ICD-10-CM | POA: Diagnosis not present

## 2017-11-23 DIAGNOSIS — R490 Dysphonia: Secondary | ICD-10-CM | POA: Diagnosis not present

## 2017-11-23 DIAGNOSIS — R05 Cough: Secondary | ICD-10-CM | POA: Diagnosis not present

## 2017-11-23 DIAGNOSIS — K1329 Other disturbances of oral epithelium, including tongue: Secondary | ICD-10-CM | POA: Diagnosis not present

## 2017-11-23 DIAGNOSIS — R1314 Dysphagia, pharyngoesophageal phase: Secondary | ICD-10-CM | POA: Diagnosis not present

## 2017-11-23 DIAGNOSIS — K1321 Leukoplakia of oral mucosa, including tongue: Secondary | ICD-10-CM | POA: Diagnosis not present

## 2017-11-23 DIAGNOSIS — F17210 Nicotine dependence, cigarettes, uncomplicated: Secondary | ICD-10-CM | POA: Diagnosis not present

## 2017-11-23 DIAGNOSIS — Z880 Allergy status to penicillin: Secondary | ICD-10-CM | POA: Diagnosis not present

## 2017-11-30 DIAGNOSIS — M25512 Pain in left shoulder: Secondary | ICD-10-CM | POA: Diagnosis not present

## 2017-11-30 DIAGNOSIS — S42022D Displaced fracture of shaft of left clavicle, subsequent encounter for fracture with routine healing: Secondary | ICD-10-CM | POA: Diagnosis not present

## 2017-12-12 DIAGNOSIS — M810 Age-related osteoporosis without current pathological fracture: Secondary | ICD-10-CM | POA: Diagnosis not present

## 2017-12-13 ENCOUNTER — Ambulatory Visit: Payer: Medicare Other | Attending: Orthopedic Surgery | Admitting: Physical Therapy

## 2017-12-13 ENCOUNTER — Encounter: Payer: Self-pay | Admitting: Physical Therapy

## 2017-12-13 DIAGNOSIS — M25512 Pain in left shoulder: Secondary | ICD-10-CM | POA: Insufficient documentation

## 2017-12-13 DIAGNOSIS — M25612 Stiffness of left shoulder, not elsewhere classified: Secondary | ICD-10-CM | POA: Diagnosis not present

## 2017-12-13 DIAGNOSIS — R293 Abnormal posture: Secondary | ICD-10-CM

## 2017-12-13 NOTE — Therapy (Signed)
[place], [place], [address] Phone: [phone]   Fax:  [phone]  Physical Therapy Evaluation  Patient Details  Name: Tracy [name] MRN: [id number] Date of Birth: [birth year] Referring Provider (PT): Carlynn Spry Date: 12/13/2017  PT End of Session - 12/13/17 1117    Visit Number  1    Date for PT Re-Evaluation  02/12/18    PT Start Time  6962    PT Stop Time  1130    PT Time Calculation (min)  38 min    Activity Tolerance  Patient tolerated treatment well    Behavior During Therapy  [facility] for tasks assessed/performed       Past Medical History:  Diagnosis Date  . Edema of leg    right leg  . Hyperlipidemia   . Hypertension   . Migraine headache   . Mitral valve prolapse   . Mitral valve regurgitation   . Osteopenia   . Persistent atrial fibrillation   . S/P mitral valve replacement    09/24/2008    Past Surgical History:  Procedure Laterality Date  . ABDOMINAL HYSTERECTOMY  1978   partial  . BLADDER SUSPENSION    . COX-MAZE MICROWAVE ABLATION  09/24/2008   complete biatrial lesion set  . ENDOVENOUS ABLATION SAPHENOUS VEIN W/ LASER  10-12-2010   right greater saphenous vein Elza Rafter MD  . MITRAL VALVE REPLACEMENT  0825/2010   51mm Medtronic Mosaic bioprosthetic tissue valve    There were no vitals filed for this visit.   Subjective Assessment - 12/13/17 1059    Subjective  Patient reports a fall in July and landing on the left shoulder.  She suffered a left clavicle fracture.  She was in a sling 6 weeks.  She reports that she did not have any exercises to do and over time she started having decreased ROM and difficulty dressing and doing her hair.  She is left handed    Limitations  Lifting;House hold activities    Patient Stated Goals  have better motions, easier dressing and doing hair    Currently in Pain?  Yes    Pain Score  4     Pain Location   Shoulder    Pain Orientation  Left    Pain Descriptors / Indicators  Aching;Sore    Pain Type  Acute pain    Pain Radiating Towards  denies    Pain Onset  More than a month ago    Pain Frequency  Constant    Aggravating Factors   reaching, doing hair, lifting pain up to 7/10    Pain Relieving Factors  rest, having the arm supported  at best 2/10, reports that she always notices the pain    Effect of Pain on Daily Activities  difficulty with everything" I am left handed"         [facility] PT Assessment - 12/13/17 0001      Assessment   Medical Diagnosis  left shoulder pain and decreased ROM    Referring Provider (PT)  Veverly Fells    Onset Date/Surgical Date  08/08/17    Hand Dominance  Left    Prior Therapy  no      Precautions   Precautions  None      Balance Screen   Has the patient fallen in the past 6 months  Yes    How many times?  1    Has the  patient had a decrease in activity level because of a fear of falling?   No    Is the patient reluctant to leave their home because of a fear of falling?   No      Home Environment   Additional Comments  does her own housework and yardwork      Prior Function   Level of Independence  Independent    Vocation  Retired    Leisure  likes to walk for exercise      Posture/Postural Control   Posture Comments  fwd head, rounded shoulders, has a large knot in the [place] joint area, the left scapula has significant medical winging      ROM / Strength   AROM / PROM / Strength  AROM;PROM;Strength      AROM   AROM Assessment Site  Shoulder    Right/Left Shoulder  Left    Left Shoulder Flexion  80 Degrees    Left Shoulder ABduction  70 Degrees    Left Shoulder Internal Rotation  30 Degrees    Left Shoulder External Rotation  45 Degrees      PROM   PROM Assessment Site  Shoulder    Right/Left Shoulder  Left    Left Shoulder Flexion  103 Degrees    Left Shoulder ABduction  92 Degrees    Left Shoulder Internal Rotation  38 Degrees    Left  Shoulder External Rotation  56 Degrees      Strength   Overall Strength Comments  ER was 3+/5, other motions 4-/5 in the ROM that she has      Palpation   Palpation comment  she is non tender, she has the knot just out lateral to the left [place] area, the left scapula really wings medially, she did have a very tender spot in the left biceps origin area                Objective measurements completed on examination: See above findings.              PT Education - 12/13/17 1116    Education Details  AAROM of the left shoulder and scapular stabilization with red tband    Person(s) Educated  Patient    Methods  Explanation;Demonstration;Tactile cues;Verbal cues;Handout    Comprehension  Verbalized understanding;Returned demonstration;Verbal cues required;Tactile cues required       PT Short Term Goals - 12/13/17 1146      PT SHORT TERM GOAL #1   Title  independent with initial HEP    Time  2    Period  Weeks    Status  New        PT Long Term Goals - 12/13/17 1146      PT LONG TERM GOAL #1   Title  understand better posture    Time  8    Period  Weeks    Status  New      PT LONG TERM GOAL #2   Title  report that she can do her hair without difficulty    Time  8    Period  Weeks    Status  New      PT LONG TERM GOAL #3   Title  report able to do her bra wihtout difficulty    Time  8    Period  Weeks    Status  New      PT LONG TERM GOAL #4   Title  reach head  high cabinet with 3#    Time  8    Period  Weeks    Status  New      PT LONG TERM GOAL #5   Title  increase left shoulder flexion to 130 degrees    Time  8    Period  Weeks    Status  New             Plan - 12/13/17 1143    Clinical Impression Statement  Patient had a fall in July, she sustained a left clavicle fracture, she was in a sling for 6 weeks.  She reports that she still has left upper arm and shoulder pain, she reports that she is having diffiuclty with dressing and  doing her hair, she is missing > 50% of normal ROM, she is very weak, she has significant scapular winging on the left.  She is left handed    Clinical Presentation  Evolving    Clinical Decision Making  Low    Rehab Potential  Good    PT Frequency  2x / week    PT Duration  8 weeks    PT Treatment/Interventions  ADLs/Self Care Home Management;Cryotherapy;Electrical Stimulation;Moist Heat;Ultrasound;Therapeutic activities;Therapeutic exercise;Patient/family education;Manual techniques    PT Next Visit Plan  Work on ROM, strength and function, needs scapular stability    Consulted and Agree with Plan of Care  Patient       Patient will benefit from skilled therapeutic intervention in order to improve the following deficits and impairments:  Decreased range of motion, Impaired UE functional use, Pain, Impaired flexibility, Decreased strength, Postural dysfunction  Visit Diagnosis: Acute pain of left shoulder - Plan: PT plan of care cert/re-cert  Stiffness of left shoulder, not elsewhere classified - Plan: PT plan of care cert/re-cert  Abnormal posture - Plan: PT plan of care cert/re-cert     Problem List Patient Active Problem List   Diagnosis Date Noted  . PAF (paroxysmal atrial fibrillation) ([place]) 02/22/2016  . Edema 08/23/2011  . Persistent atrial fibrillation   . Mitral valve regurgitation   . Hyperlipidemia   . Hypertension   . S/P mitral valve replacement     Sumner Boast., PT 12/13/2017, 11:50 AM  [place], [place], [address] Phone: [phone]   Fax:  [phone]  Name: Tracy [name] MRN: [id number] Date of Birth: 1935-10-01

## 2017-12-19 ENCOUNTER — Ambulatory Visit: Payer: Medicare Other | Admitting: Physical Therapy

## 2017-12-19 ENCOUNTER — Encounter: Payer: Self-pay | Admitting: Physical Therapy

## 2017-12-19 DIAGNOSIS — M25612 Stiffness of left shoulder, not elsewhere classified: Secondary | ICD-10-CM | POA: Diagnosis not present

## 2017-12-19 DIAGNOSIS — R293 Abnormal posture: Secondary | ICD-10-CM

## 2017-12-19 DIAGNOSIS — M25512 Pain in left shoulder: Secondary | ICD-10-CM

## 2017-12-19 NOTE — Therapy (Signed)
[place], [place], [address] Phone: [phone]   Fax:  [phone]  Physical Therapy Treatment  Patient Details  Name: Tracy [name] MRN: [id number] Date of Birth: 1935/12/09 Referring Provider (PT): Carlynn Spry Date: 12/19/2017  PT End of Session - 12/19/17 1004    Visit Number  2    Date for PT Re-Evaluation  02/12/18    PT Start Time  0925    PT Stop Time  1015    PT Time Calculation (min)  50 min    Activity Tolerance  Patient tolerated treatment well    Behavior During Therapy  [facility] for tasks assessed/performed       Past Medical History:  Diagnosis Date  . Edema of leg    right leg  . Hyperlipidemia   . Hypertension   . Migraine headache   . Mitral valve prolapse   . Mitral valve regurgitation   . Osteopenia   . Persistent atrial fibrillation   . S/P mitral valve replacement    09/24/2008    Past Surgical History:  Procedure Laterality Date  . ABDOMINAL HYSTERECTOMY  1978   partial  . BLADDER SUSPENSION    . COX-MAZE MICROWAVE ABLATION  09/24/2008   complete biatrial lesion set  . ENDOVENOUS ABLATION SAPHENOUS VEIN W/ LASER  10-12-2010   right greater saphenous vein Elza Rafter MD  . MITRAL VALVE REPLACEMENT  0825/2010   74mm Medtronic Mosaic bioprosthetic tissue valve    There were no vitals filed for this visit.  Subjective Assessment - 12/19/17 0925    Subjective  "Pretty good" Pt reports that she has been doing her exercises.     Currently in Pain?  No/denies    Pain Score  0-No pain                       [facility] Adult PT Treatment/Exercise - 12/19/17 0001      Exercises   Exercises  Shoulder      Shoulder Exercises: Standing   External Rotation  Theraband;20 reps;Both;Strengthening    Theraband Level (Shoulder External Rotation)  Level 1 (Yellow)    Extension  Theraband;20 reps;Both    Theraband Level (Shoulder Extension)   Level 2 (Red)    Row  Theraband;20 reps;Both    Theraband Level (Shoulder Row)  Level 2 (Red)    Other Standing Exercises  Cane Flex,ext, IR, x10       Shoulder Exercises: ROM/Strengthening   UBE (Upper Arm Bike)  L1 2/2      Modalities   Modalities  Moist Heat      Moist Heat Therapy   Number Minutes Moist Heat  10 Minutes    Moist Heat Location  Shoulder      Manual Therapy   Manual Therapy  Passive ROM    Manual therapy comments  Some PROM take to end range and held    Passive ROM  L shoulder all directions      Supine serratus presses 2lb LUE 2x10, tactile cues to prevent elbow bending         PT Short Term Goals - 12/19/17 1009      PT SHORT TERM GOAL #1   Title  independent with initial HEP    Status  Achieved        PT Long Term Goals - 12/13/17 1146      PT LONG TERM GOAL #1  Title  understand better posture    Time  8    Period  Weeks    Status  New      PT LONG TERM GOAL #2   Title  report that she can do her hair without difficulty    Time  8    Period  Weeks    Status  New      PT LONG TERM GOAL #3   Title  report able to do her bra wihtout difficulty    Time  8    Period  Weeks    Status  New      PT LONG TERM GOAL #4   Title  reach head high cabinet with 3#    Time  8    Period  Weeks    Status  New      PT LONG TERM GOAL #5   Title  increase left shoulder flexion to 130 degrees    Time  8    Period  Weeks    Status  New            Plan - 12/19/17 1005    Clinical Impression Statement  Pt tolerated an initial progression to TE well. She reports compliance with HEP. Pt also reports increase L shoulder mobility with ADLs. L scapular winging noted with extension sn rows. Decrease L shoulder external rotation noted with resiance. Pt very guarded during MT and unable to relax despite cues, making it difficult to access Jt integrity.    PT Frequency  2x / week    PT Duration  8 weeks    PT Next Visit Plan  Work on ROM,  strength and function, needs scapular stability       Patient will benefit from skilled therapeutic intervention in order to improve the following deficits and impairments:  Decreased range of motion, Impaired UE functional use, Pain, Impaired flexibility, Decreased strength, Postural dysfunction  Visit Diagnosis: Acute pain of left shoulder  Stiffness of left shoulder, not elsewhere classified  Abnormal posture     Problem List Patient Active Problem List   Diagnosis Date Noted  . PAF (paroxysmal atrial fibrillation) ([place]) 02/22/2016  . Edema 08/23/2011  . Persistent atrial fibrillation   . Mitral valve regurgitation   . Hyperlipidemia   . Hypertension   . S/P mitral valve replacement     Scot Jun, PTA 12/19/2017, 10:09 AM  [place], [place], [address] Phone: [phone]   Fax:  [phone]  Name: Tracy [name] MRN: [id number] Date of Birth: 10-03-1935

## 2017-12-25 ENCOUNTER — Ambulatory Visit: Payer: Medicare Other | Admitting: Physical Therapy

## 2017-12-25 DIAGNOSIS — M25512 Pain in left shoulder: Secondary | ICD-10-CM

## 2017-12-25 DIAGNOSIS — M25612 Stiffness of left shoulder, not elsewhere classified: Secondary | ICD-10-CM

## 2017-12-25 DIAGNOSIS — R293 Abnormal posture: Secondary | ICD-10-CM | POA: Diagnosis not present

## 2017-12-25 NOTE — Therapy (Signed)
[place] [address] [place], [place], [address] Phone: [phone]   Fax:  [phone]  Physical Therapy Treatment  Patient Details  Name: Tracy [name] MRN: [id number] Date of Birth: [birth year] Referring Provider (PT): Carlynn Spry Date: 12/25/2017  PT End of Session - 12/25/17 1140    Visit Number  3    Date for PT Re-Evaluation  02/12/18    PT Start Time  3378    PT Stop Time  1147    PT Time Calculation (min)  55 min    Activity Tolerance  Patient tolerated treatment well    Behavior During Therapy  [facility] for tasks assessed/performed       Past Medical History:  Diagnosis Date  . Edema of leg    right leg  . Hyperlipidemia   . Hypertension   . Migraine headache   . Mitral valve prolapse   . Mitral valve regurgitation   . Osteopenia   . Persistent atrial fibrillation   . S/P mitral valve replacement    09/24/2008    Past Surgical History:  Procedure Laterality Date  . ABDOMINAL HYSTERECTOMY  1978   partial  . BLADDER SUSPENSION    . COX-MAZE MICROWAVE ABLATION  09/24/2008   complete biatrial lesion set  . ENDOVENOUS ABLATION SAPHENOUS VEIN W/ LASER  10-12-2010   right greater saphenous vein Elza Rafter MD  . MITRAL VALVE REPLACEMENT  0825/2010   4m Medtronic Mosaic bioprosthetic tissue valve    There were no vitals filed for this visit.  Subjective Assessment - 12/25/17 1049    Subjective  "Im doing good, I can tell a little difference each day"    Currently in Pain?  Yes    Pain Score  2     Pain Location  Shoulder    Pain Orientation  Left                       [facility] Adult PT Treatment/Exercise - 12/25/17 0001      Exercises   Exercises  Shoulder      Shoulder Exercises: Standing   External Rotation  Theraband;20 reps;Both;Strengthening    Theraband Level (Shoulder External Rotation)  Level 1 (Yellow)    Extension  Theraband;20 reps;Both    Theraband Level (Shoulder Extension)  Level 2 (Red)    Row  Theraband;20 reps;Both    Theraband Level (Shoulder Row)  Level 2 (Red)    Other Standing Exercises  Cane Flex,ext 1# 2x10; Serratus push up 2x10    Other Standing Exercises  Horizontal Add 2x10 yellow Tband   back on wall to decrease compensation     Shoulder Exercises: ROM/Strengthening   UBE (Upper Arm Bike)  L1 3/3      Modalities   Modalities  Moist Heat      Moist Heat Therapy   Number Minutes Moist Heat  10 Minutes    Moist Heat Location  Shoulder      Manual Therapy   Manual Therapy  Passive ROM    Manual therapy comments  Some PROM take to end range and held    Passive ROM  L shoulder all directions               PT Short Term Goals - 12/19/17 1009      PT SHORT TERM GOAL #1   Title  independent with initial HEP    Status  Achieved  PT Long Term Goals - 12/25/17 1146      PT LONG TERM GOAL #1   Title  understand better posture    Baseline  Pt understands but has hard time demonstrating.    Time  8    Period  Weeks    Status  Partially Met      PT LONG TERM GOAL #2   Title  report that she can do her hair without difficulty    Baseline  a little better but still hard to get arm all the way up    Time  8    Period  Weeks    Status  Partially Met            Plan - 12/25/17 1141    Clinical Impression Statement  Pt continues to lack full L shoulder ER as seen by exercises. Pt demonstrates L shoulder compensation with R trunk lean when performing shoulder flexion and abduction exercises. Pt was instructed to stand against wall to help correct compensation. Pt continues to present with L shoulder scapular medial winging. Pt continues to slowly progress towards all therapy goals at this time.    Rehab Potential  Good    PT Frequency  2x / week    PT Duration  8 weeks    PT Treatment/Interventions  ADLs/Self Care Home Management;Cryotherapy;Electrical Stimulation;Moist  Heat;Ultrasound;Therapeutic activities;Therapeutic exercise;Patient/family education;Manual techniques    PT Next Visit Plan  Focus on ROM, strength and functional movemnets.     Consulted and Agree with Plan of Care  Patient       Patient will benefit from skilled therapeutic intervention in order to improve the following deficits and impairments:  Decreased range of motion, Impaired UE functional use, Pain, Impaired flexibility, Decreased strength, Postural dysfunction  Visit Diagnosis: Acute pain of left shoulder  Stiffness of left shoulder, not elsewhere classified  Abnormal posture     Problem List Patient Active Problem List   Diagnosis Date Noted  . PAF (paroxysmal atrial fibrillation) ([place]) 02/22/2016  . Edema 08/23/2011  . Persistent atrial fibrillation   . Mitral valve regurgitation   . Hyperlipidemia   . Hypertension   . S/P mitral valve replacement     Howell Rucks, SPTA 12/25/2017, 11:54 AM  [place], [place], [address] Phone: [phone]   Fax:  [phone]  Name: Tracy [name] MRN: [id number] Date of Birth: 10/25/1935

## 2017-12-27 ENCOUNTER — Ambulatory Visit: Payer: Medicare Other | Admitting: Physical Therapy

## 2017-12-27 DIAGNOSIS — M25512 Pain in left shoulder: Secondary | ICD-10-CM

## 2017-12-27 DIAGNOSIS — M25612 Stiffness of left shoulder, not elsewhere classified: Secondary | ICD-10-CM | POA: Diagnosis not present

## 2017-12-27 DIAGNOSIS — R293 Abnormal posture: Secondary | ICD-10-CM

## 2017-12-27 NOTE — Therapy (Signed)
[place] [address] [place], [place], [address] Phone: [phone]   Fax:  [phone]  Physical Therapy Treatment  Patient Details  Name: Tracy [name] MRN: [id number] Date of Birth: Feb 26, 1935 Referring Provider (PT): Carlynn Spry Date: 12/27/2017  PT End of Session - 12/27/17 1226    Visit Number  4    Date for PT Re-Evaluation  02/12/18    PT Start Time  1143    PT Stop Time  1237    PT Time Calculation (min)  54 min    Activity Tolerance  Patient tolerated treatment well    Behavior During Therapy  [facility] for tasks assessed/performed       Past Medical History:  Diagnosis Date  . Edema of leg    right leg  . Hyperlipidemia   . Hypertension   . Migraine headache   . Mitral valve prolapse   . Mitral valve regurgitation   . Osteopenia   . Persistent atrial fibrillation   . S/P mitral valve replacement    09/24/2008    Past Surgical History:  Procedure Laterality Date  . ABDOMINAL HYSTERECTOMY  1978   partial  . BLADDER SUSPENSION    . COX-MAZE MICROWAVE ABLATION  09/24/2008   complete biatrial lesion set  . ENDOVENOUS ABLATION SAPHENOUS VEIN W/ LASER  10-12-2010   right greater saphenous vein Elza Rafter MD  . MITRAL VALVE REPLACEMENT  0825/2010   16mm Medtronic Mosaic bioprosthetic tissue valve    There were no vitals filed for this visit.  Subjective Assessment - 12/27/17 1142    Subjective  "Iv been doing good, Iv been working it this week"    Currently in Pain?  No/denies         [facility] PT Assessment - 12/27/17 0001      AROM   AROM Assessment Site  Shoulder    Right/Left Shoulder  Left    Left Shoulder Flexion  145 Degrees   supine, AAROM with cane   Left Shoulder ABduction  130 Degrees   supine, AAROM with cane                  [facility] Adult PT Treatment/Exercise - 12/27/17 0001      Exercises   Exercises  Shoulder      Shoulder Exercises: Supine    Flexion  Both;20 reps;AAROM   cane   ABduction  AAROM;20 reps;Both   cane     Shoulder Exercises: Standing   External Rotation  AROM;Left;15 reps   2x10   Flexion  AROM;Strengthening;Both;20 reps;Weights    Shoulder Flexion Weight (lbs)  1    Extension  Theraband;20 reps;Both    Theraband Level (Shoulder Extension)  Level 2 (Red)    Row  Theraband;20 reps;Both    Theraband Level (Shoulder Row)  Level 3 (Green)    Other Standing Exercises  Horizontal Add 2x10 yellow Tband   on wall to decrease compensation     Shoulder Exercises: ROM/Strengthening   UBE (Upper Arm Bike)  L2 3/3      Modalities   Modalities  Moist Heat      Moist Heat Therapy   Number Minutes Moist Heat  10 Minutes    Moist Heat Location  Shoulder      Manual Therapy   Manual Therapy  Passive ROM    Manual therapy comments  Some PROM take to end range and held    Passive ROM  L shoulder all directions               PT Short Term Goals - 12/19/17 1009      PT SHORT TERM GOAL #1   Title  independent with initial HEP    Status  Achieved        PT Long Term Goals - 12/27/17 1232      PT LONG TERM GOAL #1   Title  understand better posture    Time  8    Period  Weeks    Status  On-going      PT LONG TERM GOAL #2   Title  report that she can do her hair without difficulty    Time  8    Period  Weeks    Status  On-going            Plan - 12/27/17 1227    Clinical Impression Statement  Pt continues to have R shoulder pain especially when doing R shoulder abduction and external rotation. Pt showed improved R shoulder flexion and abduction AAROM this session as compared to previous. Pt required mod VC for body machanics and to stand up tall to keep from compensating. Pt presents with R trunk lean compensation while doing standing R shoulder abduction and horizontal adduction. Pt was stood with back against wall to decrease compensation. Pt also trys to compensate with R shoulder while  doing shoulder flexion. Pt required min VC to decrease shoulder compensation.    Rehab Potential  Good    PT Frequency  2x / week    PT Duration  8 weeks    PT Treatment/Interventions  ADLs/Self Care Home Management;Cryotherapy;Electrical Stimulation;Moist Heat;Ultrasound;Therapeutic activities;Therapeutic exercise;Patient/family education;Manual techniques    PT Next Visit Plan  Focus on ROM, strength and functional movemnets.     Consulted and Agree with Plan of Care  Patient       Patient will benefit from skilled therapeutic intervention in order to improve the following deficits and impairments:  Decreased range of motion, Impaired UE functional use, Pain, Impaired flexibility, Decreased strength, Postural dysfunction  Visit Diagnosis: Acute pain of left shoulder  Stiffness of left shoulder, not elsewhere classified  Abnormal posture     Problem List Patient Active Problem List   Diagnosis Date Noted  . PAF (paroxysmal atrial fibrillation) ([place]) 02/22/2016  . Edema 08/23/2011  . Persistent atrial fibrillation   . Mitral valve regurgitation   . Hyperlipidemia   . Hypertension   . S/P mitral valve replacement     Howell Rucks, SPTA 12/27/2017, 12:33 PM  [place], [place], [address] Phone: [phone]   Fax:  [phone]  Name: Tracy [name] MRN: [id number] Date of Birth: [birth year]

## 2018-01-02 DIAGNOSIS — N183 Chronic kidney disease, stage 3 (moderate): Secondary | ICD-10-CM | POA: Diagnosis not present

## 2018-01-02 DIAGNOSIS — E782 Mixed hyperlipidemia: Secondary | ICD-10-CM | POA: Diagnosis not present

## 2018-01-02 DIAGNOSIS — I10 Essential (primary) hypertension: Secondary | ICD-10-CM | POA: Diagnosis not present

## 2018-01-04 DIAGNOSIS — M7502 Adhesive capsulitis of left shoulder: Secondary | ICD-10-CM | POA: Diagnosis not present

## 2018-01-04 DIAGNOSIS — M25512 Pain in left shoulder: Secondary | ICD-10-CM | POA: Diagnosis not present

## 2018-01-04 DIAGNOSIS — S42022D Displaced fracture of shaft of left clavicle, subsequent encounter for fracture with routine healing: Secondary | ICD-10-CM | POA: Diagnosis not present

## 2018-01-10 ENCOUNTER — Encounter: Payer: Self-pay | Admitting: Physical Therapy

## 2018-01-10 ENCOUNTER — Ambulatory Visit: Payer: Medicare Other | Attending: Orthopedic Surgery | Admitting: Physical Therapy

## 2018-01-10 DIAGNOSIS — M25612 Stiffness of left shoulder, not elsewhere classified: Secondary | ICD-10-CM | POA: Diagnosis not present

## 2018-01-10 DIAGNOSIS — M25512 Pain in left shoulder: Secondary | ICD-10-CM | POA: Insufficient documentation

## 2018-01-10 DIAGNOSIS — R293 Abnormal posture: Secondary | ICD-10-CM | POA: Diagnosis not present

## 2018-01-10 NOTE — Therapy (Signed)
[place], [place], [address] Phone: [phone]   Fax:  [phone]  Physical Therapy Treatment  Patient Details  Name: Tracy [name] MRN: [id number] Date of Birth: August 11, 1935 Referring Provider (PT): Carlynn Spry Date: 01/10/2018  PT End of Session - 01/10/18 1143    Visit Number  5    Date for PT Re-Evaluation  02/12/18    PT Start Time  1100    PT Stop Time  1150    PT Time Calculation (min)  50 min    Activity Tolerance  Patient tolerated treatment well    Behavior During Therapy  [facility] for tasks assessed/performed       Past Medical History:  Diagnosis Date  . Edema of leg    right leg  . Hyperlipidemia   . Hypertension   . Migraine headache   . Mitral valve prolapse   . Mitral valve regurgitation   . Osteopenia   . Persistent atrial fibrillation   . S/P mitral valve replacement    09/24/2008    Past Surgical History:  Procedure Laterality Date  . ABDOMINAL HYSTERECTOMY  1978   partial  . BLADDER SUSPENSION    . COX-MAZE MICROWAVE ABLATION  09/24/2008   complete biatrial lesion set  . ENDOVENOUS ABLATION SAPHENOUS VEIN W/ LASER  10-12-2010   right greater saphenous vein Elza Rafter MD  . MITRAL VALVE REPLACEMENT  0825/2010   55mm Medtronic Mosaic bioprosthetic tissue valve    There were no vitals filed for this visit.  Subjective Assessment - 01/10/18 1059    Subjective  Pt reports getting a Cortizone shot Thursday of last week and has been having increase pain since. "It did not hurt when I got it, but when I got home I tried to do my exercises and it hurt"     Currently in Pain?  Yes    Pain Score  5     Pain Location  Shoulder    Pain Orientation  Left         [facility] PT Assessment - 01/10/18 0001      AROM   AROM Assessment Site  Shoulder   Standing   Right/Left Shoulder  Left    Left Shoulder Flexion  105 Degrees    Left Shoulder ABduction  80  Degrees                   [facility] Adult PT Treatment/Exercise - 01/10/18 0001      Exercises   Exercises  Shoulder      Shoulder Exercises: Standing   Extension  Theraband;20 reps;Both    Theraband Level (Shoulder Extension)  Level 3 (Green)    Row  [employer];Both    Theraband Level (Shoulder Row)  Level 3 (Green)    Other Standing Exercises  LUE dlex and abd with ladder x5 each       Shoulder Exercises: ROM/Strengthening   UBE (Upper Arm Bike)  L2 3/3    Other ROM/Strengthening Exercises  Rows & lats 15lb 2x10       Modalities   Modalities  Moist Heat      Moist Heat Therapy   Number Minutes Moist Heat  10 Minutes    Moist Heat Location  Shoulder      Manual Therapy   Manual Therapy  Passive ROM    Manual therapy comments  Some PROM take to end range and held  Passive ROM  L shoulder all directions               PT Short Term Goals - 12/19/17 1009      PT SHORT TERM GOAL #1   Title  independent with initial HEP    Status  Achieved        PT Long Term Goals - 12/27/17 1232      PT LONG TERM GOAL #1   Title  understand better posture    Time  8    Period  Weeks    Status  On-going      PT LONG TERM GOAL #2   Title  report that she can do her hair without difficulty    Time  8    Period  Weeks    Status  On-going            Plan - 01/10/18 1148    Clinical Impression Statement  Pt reports pain after shot in L shoulder from MD. Pt with some increase in L shoulder AROM. She did well with a slightly progressed session. Pt has forward rounded shoulders. She stated that lat pull down provided a good stretch. Postural cues needed with both seated and standing rows. L shoulder PROM was very good once pt started to relax.      Rehab Potential  Good    PT Frequency  2x / week    PT Duration  8 weeks    PT Next Visit Plan  Focus on ROM, strength and functional movements.        Patient will benefit from skilled therapeutic  intervention in order to improve the following deficits and impairments:  Decreased range of motion, Impaired UE functional use, Pain, Impaired flexibility, Decreased strength, Postural dysfunction  Visit Diagnosis: Acute pain of left shoulder  Stiffness of left shoulder, not elsewhere classified  Abnormal posture     Problem List Patient Active Problem List   Diagnosis Date Noted  . PAF (paroxysmal atrial fibrillation) ([place]) 02/22/2016  . Edema 08/23/2011  . Persistent atrial fibrillation   . Mitral valve regurgitation   . Hyperlipidemia   . Hypertension   . S/P mitral valve replacement     Scot Jun 01/10/2018, 11:54 AM  [place], [place], [address] Phone: [phone]   Fax:  [phone]  Name: Tracy [name] MRN: [id number] Date of Birth: 12-24-1935

## 2018-01-11 DIAGNOSIS — G43009 Migraine without aura, not intractable, without status migrainosus: Secondary | ICD-10-CM | POA: Diagnosis not present

## 2018-01-30 ENCOUNTER — Ambulatory Visit: Payer: Medicare Other | Admitting: Physical Therapy

## 2018-01-30 DIAGNOSIS — M25612 Stiffness of left shoulder, not elsewhere classified: Secondary | ICD-10-CM | POA: Diagnosis not present

## 2018-01-30 DIAGNOSIS — R293 Abnormal posture: Secondary | ICD-10-CM

## 2018-01-30 DIAGNOSIS — M25512 Pain in left shoulder: Secondary | ICD-10-CM | POA: Diagnosis not present

## 2018-01-30 NOTE — Therapy (Addendum)
[place], [place], [address] Phone: [phone]   Fax:  [phone]  Physical Therapy Treatment Addendum added to include subjective Patient Details  Name: Tracy [name] MRN: [id number] Date of Birth: 06/02/1935 Referring Provider (PT): Carlynn Spry Date: 01/30/2018  PT End of Session - 01/30/18 1617    Visit Number  6    Date for PT Re-Evaluation  02/12/18    Authorization Type  MCR    PT Start Time  1535    PT Stop Time  1625    PT Time Calculation (min)  50 min    Activity Tolerance  Patient tolerated treatment well    Behavior During Therapy  [facility] for tasks assessed/performed       Past Medical History:  Diagnosis Date  . Edema of leg    right leg  . Hyperlipidemia   . Hypertension   . Migraine headache   . Mitral valve prolapse   . Mitral valve regurgitation   . Osteopenia   . Persistent atrial fibrillation   . S/P mitral valve replacement    09/24/2008    Past Surgical History:  Procedure Laterality Date  . ABDOMINAL HYSTERECTOMY  1978   partial  . BLADDER SUSPENSION    . COX-MAZE MICROWAVE ABLATION  09/24/2008   complete biatrial lesion set  . ENDOVENOUS ABLATION SAPHENOUS VEIN W/ LASER  10-12-2010   right greater saphenous vein Elza Rafter MD  . MITRAL VALVE REPLACEMENT  0825/2010   72mm Medtronic Mosaic bioprosthetic tissue valve    There were no vitals filed for this visit.  Subjective: Pt relays about 3/10 shoulder pain and tightness and that she was not able to do her exercises much over the holidays due to company.    [facility] PT Assessment - 01/30/18 0001      Assessment   Medical Diagnosis  left shoulder pain and decreased ROM    Referring Provider (PT)  Norris      AROM   AROM Assessment Site  Shoulder    Right/Left Shoulder  Left    Left Shoulder Flexion  115 Degrees    Left Shoulder ABduction  90 Degrees    Left Shoulder Internal  Rotation  --   [facility]                  [facility] Adult PT Treatment/Exercise - 01/30/18 0001      Exercises   Exercises  Shoulder      Shoulder Exercises: Standing   Extension  Theraband;20 reps;Both    Theraband Level (Shoulder Extension)  Level 3 (Green)    Row  [employer];Both    Theraband Level (Shoulder Row)  Level 3 (Green)    Other Standing Exercises  LUE flex and abd with ladder x10 each     Other Standing Exercises  ER isometric at wall 5 sec X 15      Shoulder Exercises: ROM/Strengthening   UBE (Upper Arm Bike)  L2 3/3    Other ROM/Strengthening Exercises  Rows & lats 15lb 2x10       Modalities   Modalities  Moist Heat      Moist Heat Therapy   Number Minutes Moist Heat  7 Minutes    Moist Heat Location  Shoulder      Manual Therapy   Manual Therapy  Passive ROM;Joint mobilization    Manual therapy comments  Some PROM take to end range and  held    Joint Mobilization  Ruth mobs     Passive ROM  L shoulder all directions               PT Short Term Goals - 12/19/17 1009      PT SHORT TERM GOAL #1   Title  independent with initial HEP    Status  Achieved        PT Long Term Goals - 12/27/17 1232      PT LONG TERM GOAL #1   Title  understand better posture    Time  8    Period  Weeks    Status  On-going      PT LONG TERM GOAL #2   Title  report that she can do her hair without difficulty    Time  8    Period  Weeks    Status  On-going            Plan - 01/30/18 1618    Clinical Impression Statement  Session focused on ROM and strengthening today to tolerance. She did have improvements in AROM meaurements today. She will continue to benefit from PT.     Rehab Potential  Good    PT Frequency  2x / week    PT Duration  8 weeks    PT Treatment/Interventions  ADLs/Self Care Home Management;Cryotherapy;Electrical Stimulation;Moist Heat;Ultrasound;Therapeutic activities;Therapeutic exercise;Patient/family education;Manual  techniques    PT Next Visit Plan  Focus on ROM, strength and functional movemnets.     Consulted and Agree with Plan of Care  Patient       Patient will benefit from skilled therapeutic intervention in order to improve the following deficits and impairments:  Decreased range of motion, Impaired UE functional use, Pain, Impaired flexibility, Decreased strength, Postural dysfunction  Visit Diagnosis: Acute pain of left shoulder  Stiffness of left shoulder, not elsewhere classified  Abnormal posture     Problem List Patient Active Problem List   Diagnosis Date Noted  . PAF (paroxysmal atrial fibrillation) ([place]) 02/22/2016  . Edema 08/23/2011  . Persistent atrial fibrillation   . Mitral valve regurgitation   . Hyperlipidemia   . Hypertension   . S/P mitral valve replacement     Silvestre Mesi 01/30/2018, 4:20 PM  [place], [place], [address] Phone: [phone]   Fax:  [phone]  Name: GWEN SARVIS MRN: [id number] Date of Birth: 07-Nov-1935

## 2018-02-06 ENCOUNTER — Encounter: Payer: Self-pay | Admitting: Physical Therapy

## 2018-02-06 ENCOUNTER — Ambulatory Visit: Payer: Medicare Other | Attending: Orthopedic Surgery | Admitting: Physical Therapy

## 2018-02-06 ENCOUNTER — Ambulatory Visit: Payer: Medicare Other | Admitting: Physical Therapy

## 2018-02-06 DIAGNOSIS — M25512 Pain in left shoulder: Secondary | ICD-10-CM | POA: Insufficient documentation

## 2018-02-06 DIAGNOSIS — R293 Abnormal posture: Secondary | ICD-10-CM | POA: Diagnosis not present

## 2018-02-06 DIAGNOSIS — M25612 Stiffness of left shoulder, not elsewhere classified: Secondary | ICD-10-CM | POA: Diagnosis not present

## 2018-02-06 NOTE — Patient Instructions (Signed)
Elbow Press   Lay on your back.  Interlace fingers; bring hands underneath head. Press elbows down. Hold __10_ seconds. Relax arms. Repeat _5__ times.  2x/day

## 2018-02-06 NOTE — Therapy (Signed)
[place], [place], [address] Phone: [phone]   Fax:  [phone]  Physical Therapy Treatment  Patient Details  Name: Tracy [name] MRN: [id number] Date of Birth: March 23, 1935 Referring Provider (PT): Dr. Veverly Fells   Encounter Date: 02/06/2018  PT End of Session - 02/06/18 1230    Visit Number  7    Date for PT Re-Evaluation  02/12/18    Authorization Type  MCR    PT Start Time  1139    PT Stop Time  1244   MHP last 10 min   PT Time Calculation (min)  65 min    Activity Tolerance  Patient tolerated treatment well;No increased pain    Behavior During Therapy  [facility] for tasks assessed/performed       Past Medical History:  Diagnosis Date  . Edema of leg    right leg  . Hyperlipidemia   . Hypertension   . Migraine headache   . Mitral valve prolapse   . Mitral valve regurgitation   . Osteopenia   . Persistent atrial fibrillation   . S/P mitral valve replacement    09/24/2008    Past Surgical History:  Procedure Laterality Date  . ABDOMINAL HYSTERECTOMY  1978   partial  . BLADDER SUSPENSION    . COX-MAZE MICROWAVE ABLATION  09/24/2008   complete biatrial lesion set  . ENDOVENOUS ABLATION SAPHENOUS VEIN W/ LASER  10-12-2010   right greater saphenous vein Elza Rafter MD  . MITRAL VALVE REPLACEMENT  0825/2010   57m Medtronic Mosaic bioprosthetic tissue valve    There were no vitals filed for this visit.  Subjective Assessment - 02/06/18 1144    Subjective  Pt reports no new changes since last visit.  "I have been working to get my arm up".  She complains she still can't get her arm up high enough to really comb the top of her head or reach high shelves.     Patient Stated Goals  have better motions, easier dressing and doing hair    Currently in Pain?  No/denies    Pain Score  0-No pain         [facility] PT Assessment - 02/06/18 0001      Assessment   Medical Diagnosis  left  shoulder pain and decreased ROM    Referring Provider (PT)  Dr. NVeverly Fells   Onset Date/Surgical Date  08/08/17    Hand Dominance  Left    Next MD Visit  PRN      AROM   AROM Assessment Site  Shoulder    Right/Left Shoulder  Left    Left Shoulder Flexion  117 Degrees    Left Shoulder ABduction  100 Degrees   compensatory movements.    Left Shoulder Internal Rotation  --   thumb to T10   Left Shoulder External Rotation  65 Degrees      PROM   Left Shoulder External Rotation  80 Degrees         [facility] Adult PT Treatment/Exercise - 02/06/18 0001      Self-Care   Self-Care  Other Self-Care Comments    Other Self-Care Comments   Pt educated on self massage with ball to pec and posterior shoulder to decrease fascial tightness and improve ROM; pt verbalized understanding.       Elbow Exercises   Elbow Flexion  Both;10 reps   bilat, 4# x 1 set, 5# x 1 set  Shoulder Exercises: Standing   External Rotation  Both;10 reps;Theraband;5 reps    Theraband Level (Shoulder External Rotation)  Level 1 (Yellow);Level 2 (Red)    External Rotation Limitations  limited motion with LUE, compensatory movements with trunk; cues to correct    Flexion  Left;10 reps;Weights   to eye level shelf.    Shoulder Flexion Weight (lbs)  2    Flexion Limitations  5 reps with 3#; fatigued quickly    Other Standing Exercises  LUE flex and abd with ladder x10 each       Shoulder Exercises: ROM/Strengthening   UBE (Upper Arm Bike)  L2: 3 min forward/ 3 min backward    Lat Pull  --   20#    Lat Pull Limitations  2 sets of 10    Cybex Row  --   20#    Cybex Row Limitations  3 sets of 10      Shoulder Exercises: Stretch   Other Shoulder Stretches  midlevel doorway stretch 30 sec x 3 reps      Modalities   Modalities  Moist Heat      Moist Heat Therapy   Number Minutes Moist Heat  10 Minutes    Moist Heat Location  Shoulder   Lt      Manual Therapy   Manual Therapy  Passive ROM;Myofascial  release;Soft tissue mobilization    Joint Mobilization  GH grade II PA mobs.     Soft tissue mobilization  STM to Lt infraspinatus, teres minor    Myofascial Release  Lt pec release.     Passive ROM  Lt shoulder in ext, hor add, flexion, abdct, ER       Supine elbow press per HEP x 5 reps of 10-15 seconds       PT Education - 02/06/18 1237    Education Details  HEP - added supine elbow press to increase Lt shoulder Abdct/ER ROM    Person(s) Educated  Patient    Methods  Explanation;Handout    Comprehension  Verbalized understanding       PT Short Term Goals - 12/19/17 1009      PT SHORT TERM GOAL #1   Title  independent with initial HEP    Status  Achieved        PT Long Term Goals - 02/06/18 1145      PT LONG TERM GOAL #1   Title  understand better posture    Time  8    Period  Weeks    Status  On-going      PT LONG TERM GOAL #2   Title  report that she can do her hair without difficulty    Baseline  continues to be difficult, but can complete    Time  8    Period  Weeks    Status  Partially Met      PT LONG TERM GOAL #3   Title  report able to do her bra wihtout difficulty    Baseline  can complete, but some difficulty.     Time  8    Period  Weeks    Status  Partially Met      PT LONG TERM GOAL #4   Title  reach head high cabinet with 3#    Time  8    Period  Weeks    Status  Achieved      PT LONG TERM GOAL #5   Title  increase left shoulder  flexion to 130 degrees    Time  8    Period  Weeks    Status  On-going            Plan - 02/06/18 1231    Clinical Impression Statement  Pt tolerated all exercises well, without increase in pain.  Pt required frequent cues to avoid comensatory strategies with trunk and shoulder with elevation of Lt arm.   ROM is gradually improving. She has partially met LTG #2 and 3, and met 4.  Pt will benefit from continued PT intervention to maximize functional mobility.     Rehab Potential  Good    PT Frequency   2x / week    PT Duration  8 weeks    PT Treatment/Interventions  ADLs/Self Care Home Management;Cryotherapy;Electrical Stimulation;Moist Heat;Ultrasound;Therapeutic activities;Therapeutic exercise;Patient/family education;Manual techniques    PT Next Visit Plan  continue progressive Lt shoulder ROM, strengthening and functional movements. End of POC, assess need for further visits.  Review self massage to shoulder girdle with ball.     Consulted and Agree with Plan of Care  Patient       Patient will benefit from skilled therapeutic intervention in order to improve the following deficits and impairments:  Decreased range of motion, Impaired UE functional use, Pain, Impaired flexibility, Decreased strength, Postural dysfunction  Visit Diagnosis: Acute pain of left shoulder  Stiffness of left shoulder, not elsewhere classified  Abnormal posture     Problem List Patient Active Problem List   Diagnosis Date Noted  . PAF (paroxysmal atrial fibrillation) ([place]) 02/22/2016  . Edema 08/23/2011  . Persistent atrial fibrillation   . Mitral valve regurgitation   . Hyperlipidemia   . Hypertension   . S/P mitral valve replacement    Kerin Perna, PTA 02/06/18 12:57 PM  [place], [place], [address] Phone: [phone]   Fax:  [phone]  Name: Tracy [name] MRN: [id number] Date of Birth: 15-Oct-1935

## 2018-02-13 ENCOUNTER — Ambulatory Visit: Payer: Medicare Other | Admitting: Physical Therapy

## 2018-02-13 DIAGNOSIS — J069 Acute upper respiratory infection, unspecified: Secondary | ICD-10-CM | POA: Diagnosis not present

## 2018-03-02 DIAGNOSIS — K1321 Leukoplakia of oral mucosa, including tongue: Secondary | ICD-10-CM | POA: Diagnosis not present

## 2018-03-31 ENCOUNTER — Other Ambulatory Visit: Payer: Self-pay | Admitting: Interventional Cardiology

## 2018-04-02 ENCOUNTER — Other Ambulatory Visit: Payer: Self-pay

## 2018-04-02 MED ORDER — AMOXICILLIN 500 MG PO CAPS: ORAL_CAPSULE | ORAL | 3 refills | Status: DC

## 2018-04-02 NOTE — Telephone Encounter (Signed)
Patient s/p MVR, needs SBE prophylaxis. Refill sent in.

## 2018-04-02 NOTE — Telephone Encounter (Signed)
Pt's pharmacy is requesting a refill on amoxicillin for dental procedure. Would Dr. Varanasi like to refill this medication? Please address 

## 2018-04-09 DIAGNOSIS — T161XXA Foreign body in right ear, initial encounter: Secondary | ICD-10-CM | POA: Diagnosis not present

## 2018-04-30 ENCOUNTER — Telehealth: Payer: Self-pay

## 2018-04-30 NOTE — Telephone Encounter (Signed)
Pt verbalized consent to phone visit. Pt says her daughter who helped her with her phone has passed away and there is no one else in the home to help her. She would prefer a phonevisit. Pt thanked me for calling.     Virtual Visit Pre-Appointment Phone Call  Steps For Call:  1. Confirm consent - "In the setting of the current Covid19 crisis, you are scheduled for a (phone or video) visit with your provider on (date) at (time).  Just as we do with many in-office visits, in order for you to participate in this visit, we must obtain consent.  If you'd like, I can send this to your mychart (if signed up) or email for you to review.  Otherwise, I can obtain your verbal consent now.  All virtual visits are billed to your insurance company just like a normal visit would be.  By agreeing to a virtual visit, we'd like you to understand that the technology does not allow for your provider to perform an examination, and thus may limit your provider's ability to fully assess your condition.  Finally, though the technology is pretty good, we cannot assure that it will always work on either your or our end, and in the setting of a video visit, we may have to convert it to a phone-only visit.  In either situation, we cannot ensure that we have a secure connection.  Are you willing to proceed?"  2. Give patient instructions for WebEx download to smartphone as below if video visit  3. Advise patient to be prepared with any vital sign or heart rhythm information, their current medicines, and a piece of paper and pen handy for any instructions they may receive the day of their visit  4. Inform patient they will receive a phone call 15 minutes prior to their appointment time (may be from unknown caller ID) so they should be prepared to answer  5. Confirm that appointment type is correct in Epic appointment notes (video vs telephone)    TELEPHONE CALL NOTE  Tracy [name] has been deemed a candidate for a  follow-up tele-health visit to limit community exposure during the Covid-19 pandemic. I spoke with the patient via phone to ensure availability of phone/video source, confirm preferred email & phone number, and discuss instructions and expectations.  I reminded Tracy [name] to be prepared with any vital sign and/or heart rhythm information that could potentially be obtained via home monitoring, at the time of her visit. I reminded Tracy [name] to expect a phone call at the time of her visit if her visit.  Did the patient verbally acknowledge consent to treatment?  [place], CMA 04/30/2018 8:36 AM   DOWNLOADING THE [place], go to [employer] and type in WebEx in the search bar. [place] [employer], the blue/green circle. The app is free but as with any other app downloads, their phone may require them to verify saved payment information or Apple password. The patient does NOT have to create an account.  - If Android, ask patient to go to [employer] and type in WebEx in the search bar. [place] [employer], the blue/green circle. The app is free but as with any other app downloads, their phone may require them to verify saved payment information or Android password. The patient does NOT have to create an account.   CONSENT FOR TELE-HEALTH VISIT - PLEASE REVIEW  I  hereby voluntarily request, consent and authorize [facility] and its employed or contracted physicians, physician assistants, nurse practitioners or other licensed health care professionals (the Practitioner), to provide me with telemedicine health care services (the "Services") as deemed necessary by the treating Practitioner. I acknowledge and consent to receive the Services by the Practitioner via telemedicine. I understand that the telemedicine visit will involve communicating with the Practitioner through live audiovisual communication technology and the  disclosure of certain medical information by electronic transmission. I acknowledge that I have been given the opportunity to request an in-person assessment or other available alternative prior to the telemedicine visit and am voluntarily participating in the telemedicine visit.  I understand that I have the right to withhold or withdraw my consent to the use of telemedicine in the course of my care at any time, without affecting my right to future care or treatment, and that the Practitioner or I may terminate the telemedicine visit at any time. I understand that I have the right to inspect all information obtained and/or recorded in the course of the telemedicine visit and may receive copies of available information for a reasonable fee.  I understand that some of the potential risks of receiving the Services via telemedicine include:  [place] Delay or interruption in medical evaluation due to technological equipment failure or disruption; . Information transmitted may not be sufficient (e.g. poor resolution of images) to allow for appropriate medical decision making by the Practitioner; and/or  . In rare instances, security protocols could fail, causing a breach of personal health information.  Furthermore, I acknowledge that it is my responsibility to provide information about my medical history, conditions and care that is complete and accurate to the best of my ability. I acknowledge that Practitioner's advice, recommendations, and/or decision may be based on factors not within their control, such as incomplete or inaccurate data provided by me or distortions of diagnostic images or specimens that may result from electronic transmissions. I understand that the practice of medicine is not an exact science and that Practitioner makes no warranties or guarantees regarding treatment outcomes. I acknowledge that I will receive a copy of this consent concurrently upon execution via email to the email address I  last provided but may also request a printed copy by calling the office of [place].    I understand that my insurance will be billed for this visit.   I have read or had this consent read to me. . I understand the contents of this consent, which adequately explains the benefits and risks of the Services being provided via telemedicine.  . I have been provided ample opportunity to ask questions regarding this consent and the Services and have had my questions answered to my satisfaction. . I give my informed consent for the services to be provided through the use of telemedicine in my medical care  By participating in this telemedicine visit I agree to the above.

## 2018-05-01 NOTE — Progress Notes (Signed)
Virtual Visit via Telephone Note    Evaluation Performed:  Follow-up visit  This visit type was conducted due to national recommendations for restrictions regarding the COVID-19 Pandemic (e.g. social distancing).  This format is felt to be most appropriate for this patient at this time.  All issues noted in this document were discussed and addressed.  No physical exam was performed (except for noted visual exam findings with Video Visits).  Please refer to the patient's chart (MyChart message for video visits and phone note for telephone visits) for the patient's consent to telehealth for [facility].  Date:  05/02/2018   ID:  Tracy [name], DOB 10-17-1935, MRN [id number]  Patient Location:  Home  Provider location:   [place], [place]  PCP:  Carol Ada, MD  Cardiologist:  No primary care provider on file. [place] Electrophysiologist:  None   Chief Complaint:  S/p MVR  History of Present Illness:    Tracy [name] is a 83 y.o. female who presents via Engineer, civil (consulting) for a telehealth visit today.   She had MVR in 8/10. No CAD at that time.   She had some persistent right leg swelling. SHe ultimately had some vein injection done. SHe wears compression hose. It was thought to be due to some lymph nodes at the right hip per her report.  Husband was diagnosed with dementia, which was a source of stress. In May 13, 2017, she was concerned about the longevity about her valve, especially with her husband's illness.    Her daughter passed away in 05-14-2018 from liver cirrhosis.  THis wa sstressful.    We discussed checking an echo in May 13, 2017, but she wanted to hold off.   The patient does not have symptoms concerning for COVID-19 infection (fever, chills, cough, or new shortness of breath).   She has had some allergies as well.   She has been staying home and following social distancing.    Denies : Chest pain. Dizziness. Leg edema. Nitroglycerin use. Orthopnea. Palpitations.  Paroxysmal nocturnal dyspnea. Shortness of breath. Syncope.    Prior CV studies:   The following studies were reviewed today:  Last ECG in 1/19 showed NSR, prolonged PR interval LDL 85.  Past Medical History:  Diagnosis Date  . Edema of leg    right leg  . Hyperlipidemia   . Hypertension   . Migraine headache   . Mitral valve prolapse   . Mitral valve regurgitation   . Osteopenia   . Persistent atrial fibrillation   . S/P mitral valve replacement    09/24/2008   Past Surgical History:  Procedure Laterality Date  . ABDOMINAL HYSTERECTOMY  1978   partial  . BLADDER SUSPENSION    . COX-MAZE MICROWAVE ABLATION  09/24/2008   complete biatrial lesion set  . ENDOVENOUS ABLATION SAPHENOUS VEIN W/ LASER  10-12-2010   right greater saphenous vein Elza Rafter MD  . MITRAL VALVE REPLACEMENT  0825/2010   48mm Medtronic Mosaic bioprosthetic tissue valve     Current Meds  Medication Sig  . acetaminophen (TYLENOL) 500 MG tablet Take 2 tablets (1,000 mg total) by mouth every 6 (six) hours as needed.  Marland Kitchen amoxicillin (AMOXIL) 500 MG capsule Take 4 capsules (2000 mg) 1 hour prior to dental procdure  . aspirin 81 MG tablet Take 81 mg by mouth daily.    . Calcium 1200-1000 MG-UNIT CHEW Chew by mouth daily.  . cephALEXin (KEFLEX) 250 MG capsule Take 250 mg by mouth daily.  . furosemide (LASIX) 40  MG tablet Take 40 mg by mouth daily.   Marland Kitchen gabapentin (NEURONTIN) 600 MG tablet Take 600 mg by mouth 2 (two) times daily.    . metoprolol succinate (TOPROL-XL) 25 MG 24 hr tablet Take 25 mg by mouth daily.    . Multiple Vitamin (MULTIVITAMIN) tablet Take 1 tablet by mouth daily.    . potassium chloride SA (K-DUR,KLOR-CON) 20 MEQ tablet Take 20 mEq by mouth daily.    . SUMAtriptan Succinate (IMITREX PO) Take 1 tablet by mouth daily as needed (MIGRAINES).   Marland Kitchen topiramate (TOPAMAX) 100 MG tablet Take 200 mg by mouth daily.   . [DISCONTINUED] cephALEXin (KEFLEX) 250 MG capsule Take 250 mg by mouth  daily.     Allergies:   Bacitracin-neomycin-polymyxin; Ciprofloxacin; Nitrofurantoin macrocrystal; Sulfamethoxazole-trimethoprim; Augmentin [amoxicillin-pot clavulanate]; Monosodium glutamate; Sulfa antibiotics; and Neosporin [neomycin-bacitracin zn-polymyx]   Social History   Tobacco Use  . Smoking status: Former Smoker    Types: Cigarettes    Last attempt to quit: 02/01/1996    Years since quitting: 22.2  . Smokeless tobacco: Never Used  Substance Use Topics  . Alcohol use: Yes    Comment: 1-2 drinks  weekly  . Drug use: No     Family Hx: The patient's family history includes Heart disease in her father; Stroke in her maternal grandmother. There is no history of Diabetes or Heart attack.  ROS:   Please see the history of present illness.    Chronic right leg edema All other systems reviewed and are negative.   Labs/Other Tests and Data Reviewed:    Recent Labs: No results found for requested labs within last 8760 hours.   Recent Lipid Panel No results found for: CHOL, TRIG, HDL, CHOLHDL, LDLCALC, LDLDIRECT  Wt Readings from Last 3 Encounters:  05/02/18 145 lb (65.8 kg)  02/16/17 154 lb (69.9 kg)  02/22/16 153 lb (69.4 kg)     Objective:    Vital Signs:  BP 115/62   Ht 5\' 9"  (1.753 m)   Wt 145 lb (65.8 kg)   BMI 21.41 kg/m  P 81 today, 112/68 just now  Unable to do P.E. due to phone format.  ASSESSMENT & PLAN:    1.  S/p MVR: needs SBE prophylaxis for dental procedure- was done recently.  Would consider repeat echo when COVID 19 outbreak has settled down.  2.  PAF: On metoprolol.  Has been off of anticoagulation for some time.  Needs ECG at the next in person visit.  No palpitations.  3.  Hyperlipidemia: COntrolled in June 2019. COntinue healthy diet.  4.  Chronic right leg edema: Stable. Now thought that it was lymphedema.    COVID-19 Education: The signs and symptoms of COVID-19 were discussed with the patient and how to seek care for testing (follow up  with PCP or arrange E-visit).  The importance of social distancing was discussed today.  Patient Risk:   After full review of this patient's clinical status, I feel that they are at least moderate risk at this time.  Time:   Today, I have spent 25 minutes with the patient with telehealth technology discussing Mitral valve replacement, her cardiac issues and her complicated social issues.     Medication Adjustments/Labs and Tests Ordered: Current medicines are reviewed at length with the patient today.  Concerns regarding medicines are outlined above.  Tests Ordered: No orders of the defined types were placed in this encounter.  Medication Changes: No orders of the defined types were placed in  this encounter.   Disposition:  Follow up in 1 year(s)  Signed, Larae Grooms, MD  05/02/2018 10:09 AM     [facility]

## 2018-05-02 ENCOUNTER — Telehealth (INDEPENDENT_AMBULATORY_CARE_PROVIDER_SITE_OTHER): Payer: Medicare Other | Admitting: Interventional Cardiology

## 2018-05-02 ENCOUNTER — Encounter: Payer: Self-pay | Admitting: Interventional Cardiology

## 2018-05-02 ENCOUNTER — Other Ambulatory Visit: Payer: Self-pay

## 2018-05-02 VITALS — BP 115/62 | Ht 69.0 in | Wt 145.0 lb

## 2018-05-02 DIAGNOSIS — E782 Mixed hyperlipidemia: Secondary | ICD-10-CM

## 2018-05-02 DIAGNOSIS — I480 Paroxysmal atrial fibrillation: Secondary | ICD-10-CM

## 2018-05-02 DIAGNOSIS — Z952 Presence of prosthetic heart valve: Secondary | ICD-10-CM | POA: Diagnosis not present

## 2018-05-02 DIAGNOSIS — R600 Localized edema: Secondary | ICD-10-CM | POA: Diagnosis not present

## 2018-05-02 NOTE — Patient Instructions (Signed)

## 2018-06-20 DIAGNOSIS — M810 Age-related osteoporosis without current pathological fracture: Secondary | ICD-10-CM | POA: Diagnosis not present

## 2018-07-12 DIAGNOSIS — G43009 Migraine without aura, not intractable, without status migrainosus: Secondary | ICD-10-CM | POA: Diagnosis not present

## 2018-07-18 DIAGNOSIS — Z1389 Encounter for screening for other disorder: Secondary | ICD-10-CM | POA: Diagnosis not present

## 2018-07-18 DIAGNOSIS — Z952 Presence of prosthetic heart valve: Secondary | ICD-10-CM | POA: Diagnosis not present

## 2018-07-18 DIAGNOSIS — I10 Essential (primary) hypertension: Secondary | ICD-10-CM | POA: Diagnosis not present

## 2018-07-18 DIAGNOSIS — Z0000 Encounter for general adult medical examination without abnormal findings: Secondary | ICD-10-CM | POA: Diagnosis not present

## 2018-07-18 DIAGNOSIS — I480 Paroxysmal atrial fibrillation: Secondary | ICD-10-CM | POA: Diagnosis not present

## 2018-07-18 DIAGNOSIS — E782 Mixed hyperlipidemia: Secondary | ICD-10-CM | POA: Diagnosis not present

## 2018-08-08 DIAGNOSIS — R109 Unspecified abdominal pain: Secondary | ICD-10-CM | POA: Diagnosis not present

## 2018-08-08 DIAGNOSIS — N3020 Other chronic cystitis without hematuria: Secondary | ICD-10-CM | POA: Diagnosis not present

## 2018-08-08 DIAGNOSIS — E782 Mixed hyperlipidemia: Secondary | ICD-10-CM | POA: Diagnosis not present

## 2018-08-08 DIAGNOSIS — I10 Essential (primary) hypertension: Secondary | ICD-10-CM | POA: Diagnosis not present

## 2018-11-09 DIAGNOSIS — Z23 Encounter for immunization: Secondary | ICD-10-CM | POA: Diagnosis not present

## 2018-11-09 DIAGNOSIS — R946 Abnormal results of thyroid function studies: Secondary | ICD-10-CM | POA: Diagnosis not present

## 2018-11-27 DIAGNOSIS — M8580 Other specified disorders of bone density and structure, unspecified site: Secondary | ICD-10-CM | POA: Diagnosis not present

## 2018-11-27 DIAGNOSIS — N183 Chronic kidney disease, stage 3 unspecified: Secondary | ICD-10-CM | POA: Diagnosis not present

## 2018-11-27 DIAGNOSIS — E782 Mixed hyperlipidemia: Secondary | ICD-10-CM | POA: Diagnosis not present

## 2018-11-27 DIAGNOSIS — I10 Essential (primary) hypertension: Secondary | ICD-10-CM | POA: Diagnosis not present

## 2018-11-27 DIAGNOSIS — J439 Emphysema, unspecified: Secondary | ICD-10-CM | POA: Diagnosis not present

## 2018-11-27 DIAGNOSIS — I480 Paroxysmal atrial fibrillation: Secondary | ICD-10-CM | POA: Diagnosis not present

## 2018-12-31 DIAGNOSIS — M810 Age-related osteoporosis without current pathological fracture: Secondary | ICD-10-CM | POA: Diagnosis not present

## 2019-02-07 DIAGNOSIS — N3020 Other chronic cystitis without hematuria: Secondary | ICD-10-CM | POA: Diagnosis not present

## 2019-02-25 IMAGING — CT CT CHEST W/O CM
3 of 4 series · 17 of 30 positions shown, 19 images · non-contrast
Comparison: Chest x-ray 11/03/2008.

CLINICAL DATA: Pneumonia right lower lobe

EXAM:
CT CHEST WITHOUT CONTRAST
TECHNIQUE: Multidetector CT imaging of the chest was performed following the
standard protocol without IV contrast.

[Series 3: chest w/o · axial · non-contrast · 0.70mm/px · z∈[-227,-15]mm · 6 of 119 slices shown]
[im 17/119  lung]
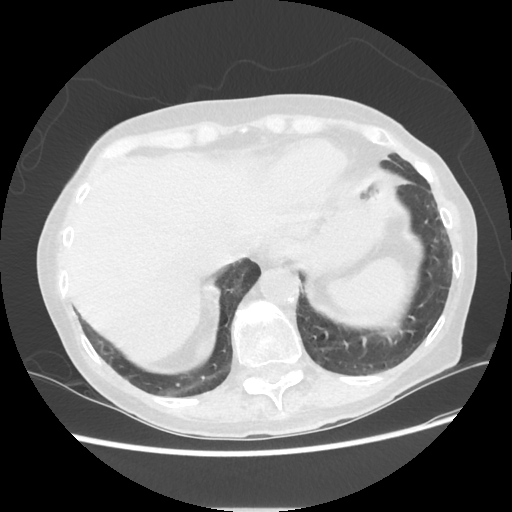
[im 34/119  lung]
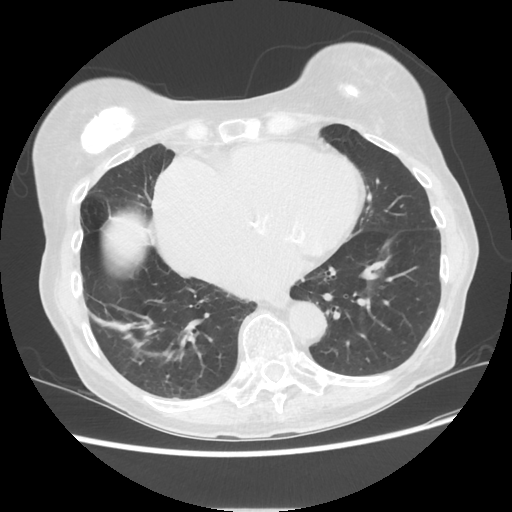
[im 51/119  lung]
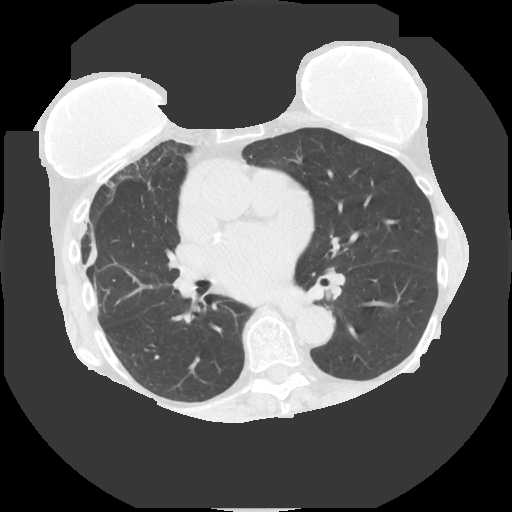
[im 68/119  lung]
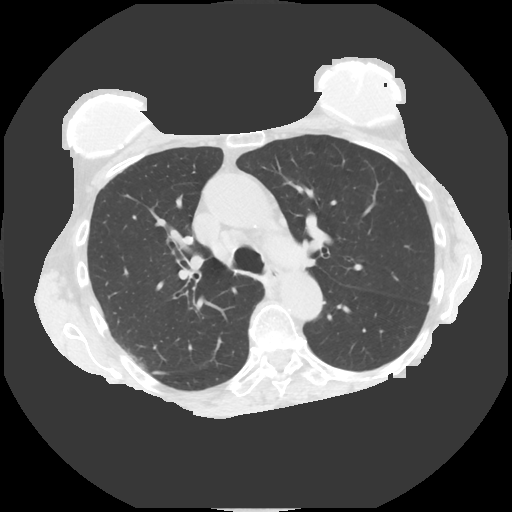
[im 85/119  lung]
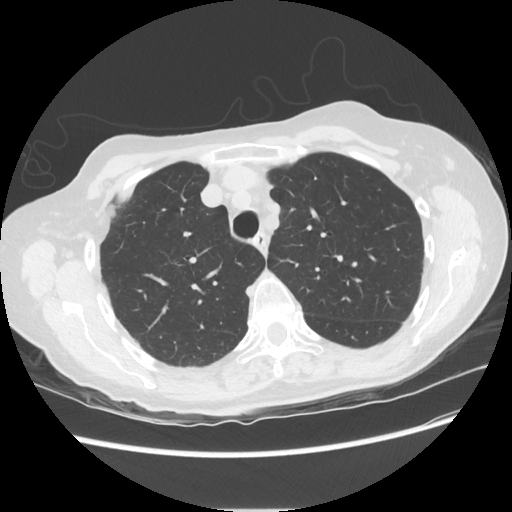
[im 102/119  lung]
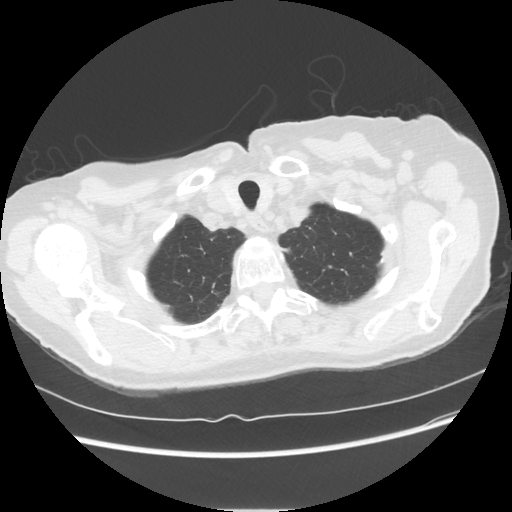

[Series 4: lung windows · axial · 0.70mm/px · z∈[-232,-10]mm · 7 of 119 slices shown, 9 images]
[im 15/119  mediastinal]
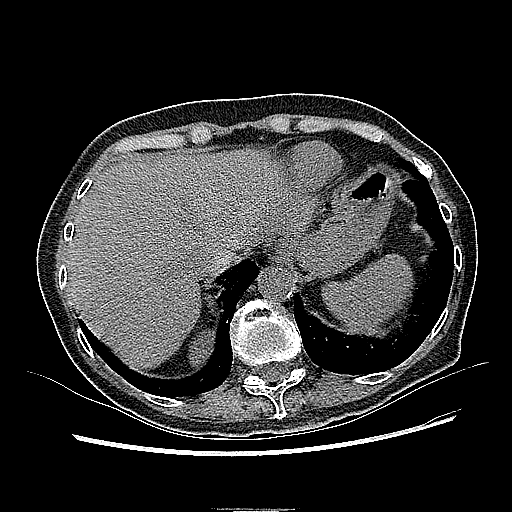
[im 15/119  lung]
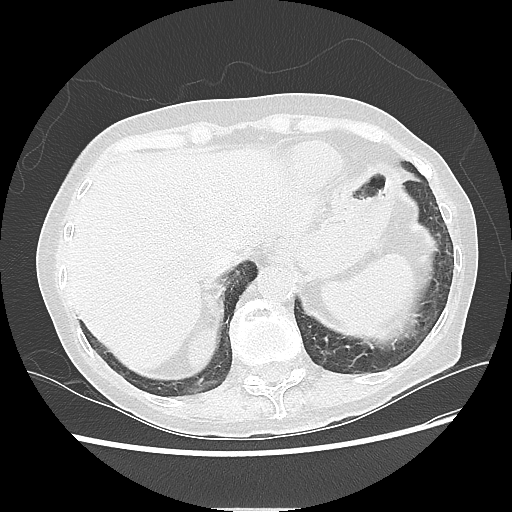
[im 30/119  lung]
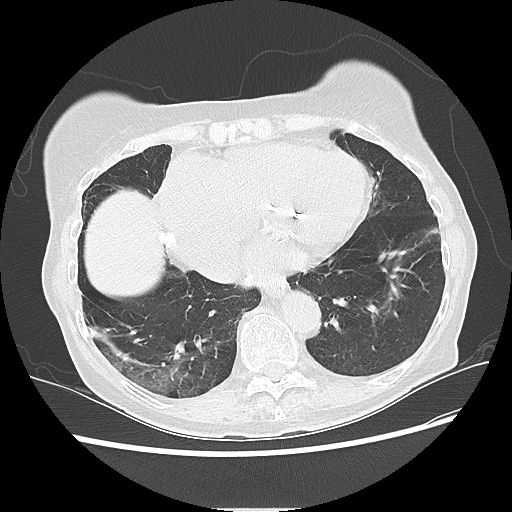
[im 45/119  lung]
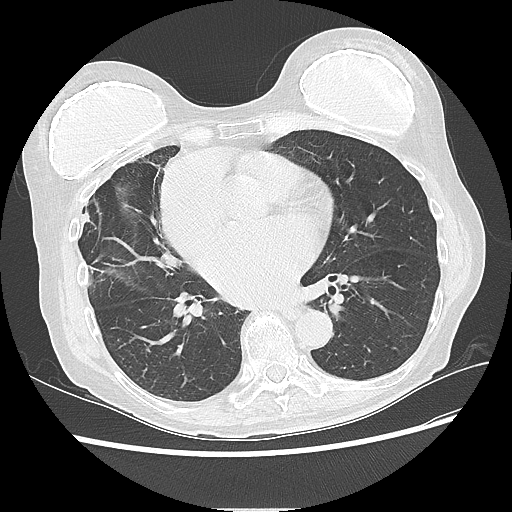
[im 60/119  lung]
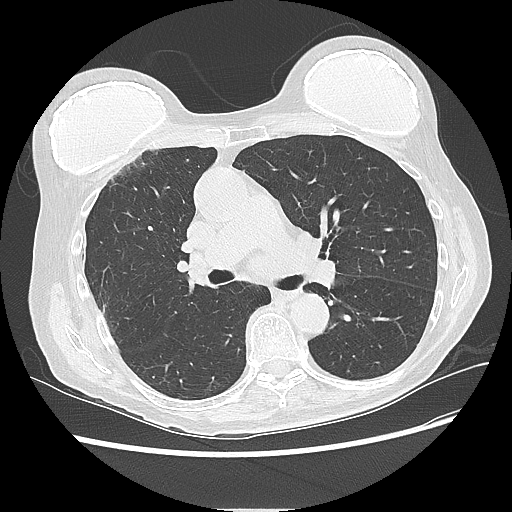
[im 74/119  mediastinal]
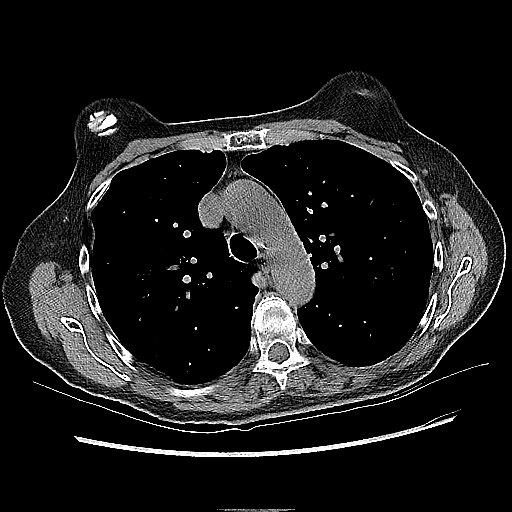
[im 74/119  lung]
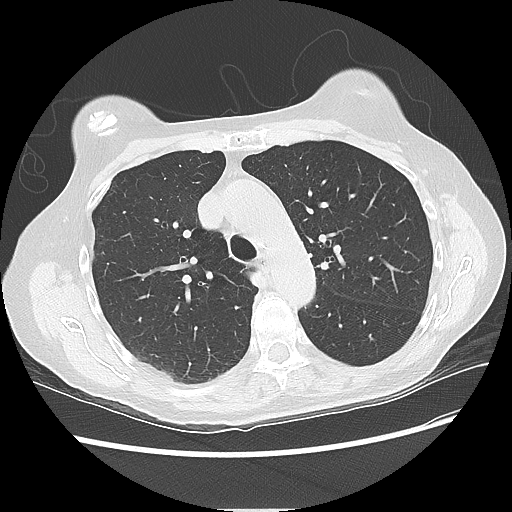
[im 89/119  lung]
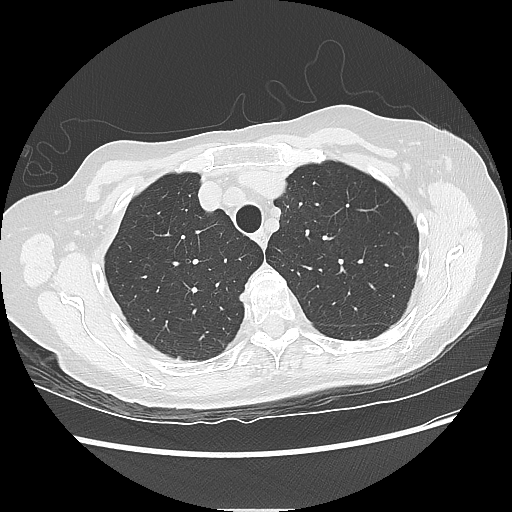
[im 104/119  lung]
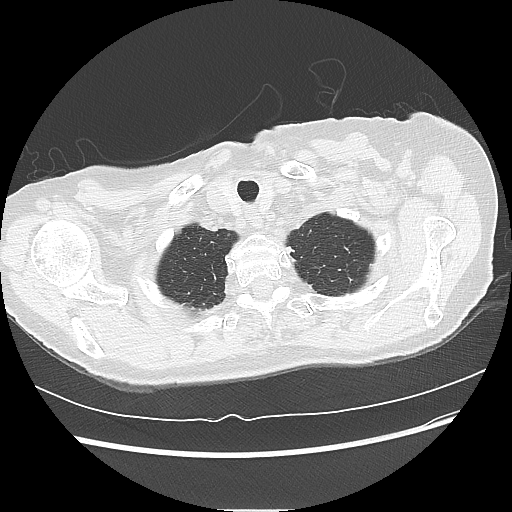

[Series 602: sagittal body · sagittal · 0.70mm/px · 4 of 144 slices shown]
[im 15/144  mediastinal]
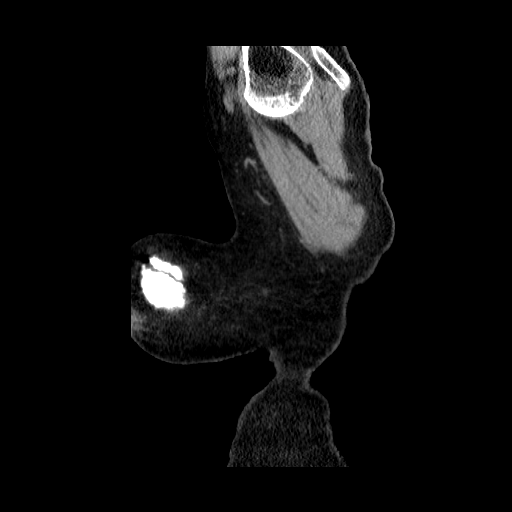
[im 29/144  mediastinal]
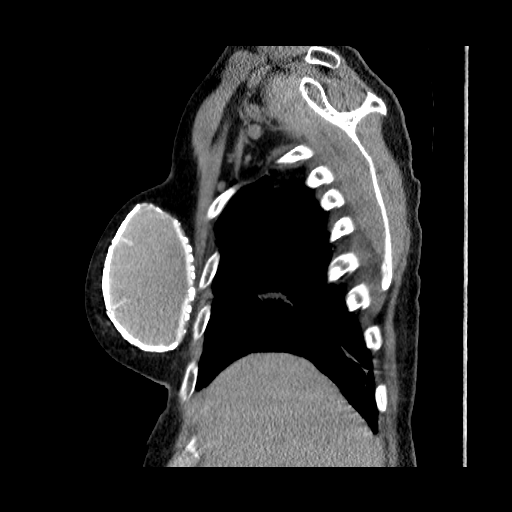
[im 43/144  mediastinal]
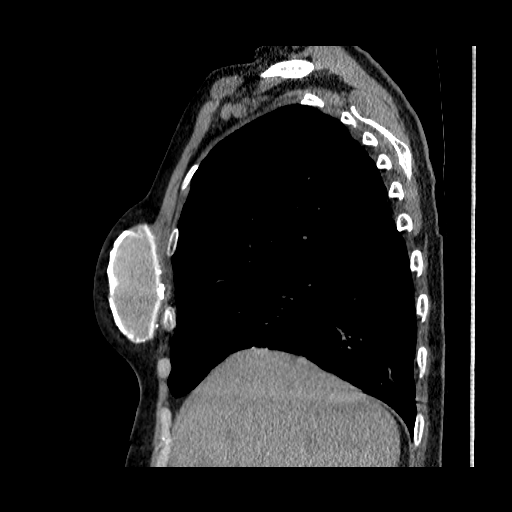
[im 58/144  mediastinal]
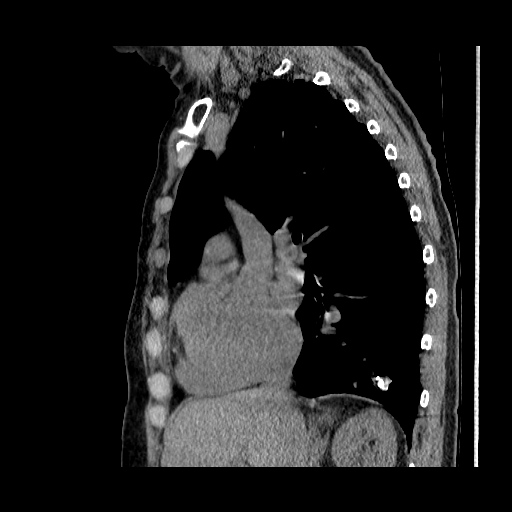

[17 of 30 positions shown; findings below may reference images not displayed]

FINDINGS: Cardiovascular: Cardiomegaly. Scattered aortic calcifications. No
aneurysm.

Mediastinum/Nodes: Mildly prominent right paratracheal lymph node
with a short axis diameter of 12 mm.

Lungs/Pleura: Scarring in the apices and lung bases, both in the
lower lobes, right middle lobe and lingula. Partially calcified 13
mm nodule in the right lower lobe, likely reflects granuloma or
hamartoma.

Upper Abdomen: Imaging into the upper abdomen shows no acute
findings.

Musculoskeletal: Bilateral calcified breast implants. No soft tissue
abnormality. No acute bony abnormality.
IMPRESSION: Linear areas of scarring in both apices and lung bases.

13 mm partially calcified nodule in the right lower lobe, likely
granuloma or hamartoma. This could be followed with repeat CT in
6-12 months to confirm stability.

Cardiomegaly

Aortic Atherosclerosis ([id number]).

## 2019-02-28 DIAGNOSIS — I10 Essential (primary) hypertension: Secondary | ICD-10-CM | POA: Diagnosis not present

## 2019-02-28 DIAGNOSIS — M8580 Other specified disorders of bone density and structure, unspecified site: Secondary | ICD-10-CM | POA: Diagnosis not present

## 2019-02-28 DIAGNOSIS — J439 Emphysema, unspecified: Secondary | ICD-10-CM | POA: Diagnosis not present

## 2019-02-28 DIAGNOSIS — E782 Mixed hyperlipidemia: Secondary | ICD-10-CM | POA: Diagnosis not present

## 2019-02-28 DIAGNOSIS — I480 Paroxysmal atrial fibrillation: Secondary | ICD-10-CM | POA: Diagnosis not present

## 2019-02-28 DIAGNOSIS — N1831 Chronic kidney disease, stage 3a: Secondary | ICD-10-CM | POA: Diagnosis not present

## 2019-03-04 DIAGNOSIS — G43009 Migraine without aura, not intractable, without status migrainosus: Secondary | ICD-10-CM | POA: Diagnosis not present

## 2019-03-26 DIAGNOSIS — H25043 Posterior subcapsular polar age-related cataract, bilateral: Secondary | ICD-10-CM | POA: Diagnosis not present

## 2019-03-26 DIAGNOSIS — H353132 Nonexudative age-related macular degeneration, bilateral, intermediate dry stage: Secondary | ICD-10-CM | POA: Diagnosis not present

## 2019-03-26 DIAGNOSIS — H18413 Arcus senilis, bilateral: Secondary | ICD-10-CM | POA: Diagnosis not present

## 2019-03-26 DIAGNOSIS — H2511 Age-related nuclear cataract, right eye: Secondary | ICD-10-CM | POA: Diagnosis not present

## 2019-03-26 DIAGNOSIS — H2513 Age-related nuclear cataract, bilateral: Secondary | ICD-10-CM | POA: Diagnosis not present

## 2019-03-26 DIAGNOSIS — H25013 Cortical age-related cataract, bilateral: Secondary | ICD-10-CM | POA: Diagnosis not present

## 2019-04-01 DIAGNOSIS — Z23 Encounter for immunization: Secondary | ICD-10-CM | POA: Diagnosis not present

## 2019-04-11 DIAGNOSIS — R399 Unspecified symptoms and signs involving the genitourinary system: Secondary | ICD-10-CM | POA: Diagnosis not present

## 2019-04-11 DIAGNOSIS — N3000 Acute cystitis without hematuria: Secondary | ICD-10-CM | POA: Diagnosis not present

## 2019-04-15 DIAGNOSIS — H2511 Age-related nuclear cataract, right eye: Secondary | ICD-10-CM | POA: Diagnosis not present

## 2019-04-16 DIAGNOSIS — H25012 Cortical age-related cataract, left eye: Secondary | ICD-10-CM | POA: Diagnosis not present

## 2019-04-16 DIAGNOSIS — H2512 Age-related nuclear cataract, left eye: Secondary | ICD-10-CM | POA: Diagnosis not present

## 2019-04-16 DIAGNOSIS — H25042 Posterior subcapsular polar age-related cataract, left eye: Secondary | ICD-10-CM | POA: Diagnosis not present

## 2019-04-29 DIAGNOSIS — Z23 Encounter for immunization: Secondary | ICD-10-CM | POA: Diagnosis not present

## 2019-05-13 DIAGNOSIS — H2512 Age-related nuclear cataract, left eye: Secondary | ICD-10-CM | POA: Diagnosis not present

## 2019-05-27 DIAGNOSIS — L821 Other seborrheic keratosis: Secondary | ICD-10-CM | POA: Diagnosis not present

## 2019-05-27 DIAGNOSIS — L719 Rosacea, unspecified: Secondary | ICD-10-CM | POA: Diagnosis not present

## 2019-05-27 DIAGNOSIS — D225 Melanocytic nevi of trunk: Secondary | ICD-10-CM | POA: Diagnosis not present

## 2019-05-27 DIAGNOSIS — L570 Actinic keratosis: Secondary | ICD-10-CM | POA: Diagnosis not present

## 2019-05-28 DIAGNOSIS — I10 Essential (primary) hypertension: Secondary | ICD-10-CM | POA: Diagnosis not present

## 2019-05-28 DIAGNOSIS — M8580 Other specified disorders of bone density and structure, unspecified site: Secondary | ICD-10-CM | POA: Diagnosis not present

## 2019-05-28 DIAGNOSIS — I480 Paroxysmal atrial fibrillation: Secondary | ICD-10-CM | POA: Diagnosis not present

## 2019-05-28 DIAGNOSIS — N183 Chronic kidney disease, stage 3 unspecified: Secondary | ICD-10-CM | POA: Diagnosis not present

## 2019-05-28 DIAGNOSIS — E782 Mixed hyperlipidemia: Secondary | ICD-10-CM | POA: Diagnosis not present

## 2019-05-28 DIAGNOSIS — J439 Emphysema, unspecified: Secondary | ICD-10-CM | POA: Diagnosis not present

## 2019-05-28 NOTE — Progress Notes (Signed)
Cardiology Office Note   Date:  05/29/2019   ID:  Tracy [name], DOB 07/17/1935, MRN [id number]  PCP:  Carol Ada, MD    No chief complaint on file.  S/p MVR  Wt Readings from Last 3 Encounters:  05/29/19 152 lb 3.2 oz (69 kg)  05/02/18 145 lb (65.8 kg)  02/16/17 154 lb (69.9 kg)       History of Present Illness: Tracy [name] is a 84 y.o. female  had MVR and Maze procedure in 8/10.No CAD at that time.  She had some persistent right leg swelling. SHe ultimately had some vein injection done. SHe wears compression hose. It was thought to be due to some lymph nodes at the right hip per her report.  Husband was diagnosed with dementia, which was a source of stress. In 2017-05-16, she was concerned about the longevity about her valve, especially with her husband's illness. He passed away in 17-May-2018 from [place].  Her daughter passed away in 2018-05-17 from liver cirrhosis.  THis was stressful.    We discussed checking an echo in 05-16-17, but she wanted to hold off.  The patient does not have symptoms concerning for COVID-19 infection (fever, chills, cough, or new shortness of breath).   She stayed healthy over the past year.  She was social distancing.  She got her COVID vaccines.   She has had some allergies as well.    Denies : Chest pain. Dizziness. Leg edema. Nitroglycerin use. Orthopnea. Palpitations. Paroxysmal nocturnal dyspnea. Shortness of breath. Syncope.   She walks regularly.  Lives across the street from her son-in-law (husband of the daughter who passed away)     Past Medical History:  Diagnosis Date  . Edema of leg    right leg  . Hyperlipidemia   . Hypertension   . Migraine headache   . Mitral valve prolapse   . Mitral valve regurgitation   . Osteopenia   . Persistent atrial fibrillation ([place])   . S/P mitral valve replacement    09/24/2008    Past Surgical History:  Procedure Laterality Date  . ABDOMINAL HYSTERECTOMY  1978   partial  .  BLADDER SUSPENSION    . COX-MAZE MICROWAVE ABLATION  09/24/2008   complete biatrial lesion set  . ENDOVENOUS ABLATION SAPHENOUS VEIN W/ LASER  10-12-2010   right greater saphenous vein Elza Rafter MD  . MITRAL VALVE REPLACEMENT  0825/2010   45mm Medtronic Mosaic bioprosthetic tissue valve     Current Outpatient Medications  Medication Sig Dispense Refill  . acetaminophen (TYLENOL) 500 MG tablet Take 2 tablets (1,000 mg total) by mouth every 6 (six) hours as needed. 30 tablet 0  . amoxicillin (AMOXIL) 500 MG capsule Take 4 capsules (2000 mg) 1 hour prior to dental procdure 4 capsule 3  . aspirin 81 MG tablet Take 81 mg by mouth daily.      . Calcium 1200-1000 MG-UNIT CHEW Chew by mouth daily.    . cephALEXin (KEFLEX) 250 MG capsule Take 250 mg by mouth daily.    . furosemide (LASIX) 40 MG tablet Take 40 mg by mouth daily.     Marland Kitchen gabapentin (NEURONTIN) 600 MG tablet Take 600 mg by mouth 2 (two) times daily.      . metoprolol succinate (TOPROL-XL) 25 MG 24 hr tablet Take 25 mg by mouth daily.      . Multiple Vitamin (MULTIVITAMIN) tablet Take 1 tablet by mouth daily.      . potassium chloride  SA (K-DUR,KLOR-CON) 20 MEQ tablet Take 20 mEq by mouth daily.      . SUMAtriptan Succinate (IMITREX PO) Take 1 tablet by mouth daily as needed (MIGRAINES).     Marland Kitchen topiramate (TOPAMAX) 100 MG tablet Take 200 mg by mouth daily.      No current facility-administered medications for this visit.    Allergies:   Bacitracin-neomycin-polymyxin, Ciprofloxacin, Nitrofurantoin macrocrystal, Sulfamethoxazole-trimethoprim, Augmentin [amoxicillin-pot clavulanate], Monosodium glutamate, Sulfa antibiotics, and Neosporin [neomycin-bacitracin zn-polymyx]    Social History:  The patient  reports that she quit smoking about 23 years ago. Her smoking use included cigarettes. She has never used smokeless tobacco. She reports current alcohol use. She reports that she does not use drugs.   Family History:  The patient's  family history includes Heart disease in her father; Stroke in her maternal grandmother.    ROS:  Please see the history of present illness.   Otherwise, review of systems are positive for recent bladder infections.   All other systems are reviewed and negative.    PHYSICAL EXAM: VS:  BP (!) 148/88   Pulse 80   Ht 5\' 9"  (1.753 m)   Wt 152 lb 3.2 oz (69 kg)   SpO2 98%   BMI 22.48 kg/m  , BMI Body mass index is 22.48 kg/m. GEN: Well nourished, well developed, in no acute distress  HEENT: normal  Neck: no JVD, carotid bruits, or masses Cardiac: RRR; no murmurs, rubs, or gallops,no edema  Respiratory:  clear to auscultation bilaterally, normal work of breathing GI: soft, nontender, nondistended, + BS MS: no deformity or atrophy  Skin: warm and dry, no rash Neuro:  Strength and sensation are intact Psych: euthymic mood, full affect   EKG:   The ekg ordered today demonstrates NSR, prolonged PR interval   Recent Labs: No results found for requested labs within last 8760 hours.   Lipid Panel No results found for: CHOL, TRIG, HDL, CHOLHDL, VLDL, LDLCALC, LDLDIRECT   Other studies Reviewed: Additional studies/ records that were reviewed today with results demonstrating: LDL 69 in 08/2018.     ASSESSMENT AND PLAN:  1. S/p MVR: needs SBE prophylaxis.  COnsider repeat echo.  No sx of CHF. No murmur on exam.  2. PAF: On metoprolol.  No sx of AFib. 3. Hyperlipidemia: Well controlled. Eating healthy. 4. Right leg edema:- chronic. Thought to be lymphedema.  Elevate right leg.    5. HTN: BP is in the 123456 mm Hg systolic range at home. 140/74 on my recheck in the office.    Current medicines are reviewed at length with the patient today.  The patient concerns regarding her medicines were addressed.  The following changes have been made:  No change  Labs/ tests ordered today include:  No orders of the defined types were placed in this encounter.   Recommend 150 minutes/week of  aerobic exercise Low fat, low carb, high fiber diet recommended  Disposition:   FU in 1 year   Signed, Larae Grooms, MD  05/29/2019 1:34 PM    [facility] [place], [place], [place]  [address] Phone: [phone]; Fax: [phone]

## 2019-05-29 ENCOUNTER — Encounter: Payer: Self-pay | Admitting: Interventional Cardiology

## 2019-05-29 ENCOUNTER — Other Ambulatory Visit: Payer: Self-pay

## 2019-05-29 ENCOUNTER — Ambulatory Visit (INDEPENDENT_AMBULATORY_CARE_PROVIDER_SITE_OTHER): Payer: Medicare Other | Admitting: Interventional Cardiology

## 2019-05-29 VITALS — BP 148/88 | HR 80 | Ht 69.0 in | Wt 152.2 lb

## 2019-05-29 DIAGNOSIS — E782 Mixed hyperlipidemia: Secondary | ICD-10-CM | POA: Diagnosis not present

## 2019-05-29 DIAGNOSIS — Z952 Presence of prosthetic heart valve: Secondary | ICD-10-CM | POA: Diagnosis not present

## 2019-05-29 DIAGNOSIS — N3020 Other chronic cystitis without hematuria: Secondary | ICD-10-CM | POA: Diagnosis not present

## 2019-05-29 DIAGNOSIS — I480 Paroxysmal atrial fibrillation: Secondary | ICD-10-CM

## 2019-05-29 DIAGNOSIS — R300 Dysuria: Secondary | ICD-10-CM | POA: Diagnosis not present

## 2019-05-29 NOTE — Patient Instructions (Signed)

## 2019-05-31 ENCOUNTER — Other Ambulatory Visit: Payer: Self-pay | Admitting: Interventional Cardiology

## 2019-05-31 NOTE — Telephone Encounter (Signed)
Refill sent in for SBE prophylaxis.

## 2019-05-31 NOTE — Telephone Encounter (Signed)
Pt's pharmacy is requesting a refill on amoxicillin. Would Dr. Irish Lack like to refill this medication? Please address

## 2019-06-06 DIAGNOSIS — G43009 Migraine without aura, not intractable, without status migrainosus: Secondary | ICD-10-CM | POA: Diagnosis not present

## 2019-06-18 DIAGNOSIS — M8000XD Age-related osteoporosis with current pathological fracture, unspecified site, subsequent encounter for fracture with routine healing: Secondary | ICD-10-CM | POA: Diagnosis not present

## 2019-06-25 DIAGNOSIS — M8000XD Age-related osteoporosis with current pathological fracture, unspecified site, subsequent encounter for fracture with routine healing: Secondary | ICD-10-CM | POA: Diagnosis not present

## 2019-06-25 DIAGNOSIS — E876 Hypokalemia: Secondary | ICD-10-CM | POA: Diagnosis not present

## 2019-06-25 DIAGNOSIS — I10 Essential (primary) hypertension: Secondary | ICD-10-CM | POA: Diagnosis not present

## 2019-07-18 DIAGNOSIS — N183 Chronic kidney disease, stage 3 unspecified: Secondary | ICD-10-CM | POA: Diagnosis not present

## 2019-07-18 DIAGNOSIS — Z952 Presence of prosthetic heart valve: Secondary | ICD-10-CM | POA: Diagnosis not present

## 2019-07-18 DIAGNOSIS — E782 Mixed hyperlipidemia: Secondary | ICD-10-CM | POA: Diagnosis not present

## 2019-07-18 DIAGNOSIS — I10 Essential (primary) hypertension: Secondary | ICD-10-CM | POA: Diagnosis not present

## 2019-07-18 DIAGNOSIS — Z0000 Encounter for general adult medical examination without abnormal findings: Secondary | ICD-10-CM | POA: Diagnosis not present

## 2019-07-18 DIAGNOSIS — I700 Atherosclerosis of aorta: Secondary | ICD-10-CM | POA: Diagnosis not present

## 2019-07-18 DIAGNOSIS — R197 Diarrhea, unspecified: Secondary | ICD-10-CM | POA: Diagnosis not present

## 2019-07-18 DIAGNOSIS — I480 Paroxysmal atrial fibrillation: Secondary | ICD-10-CM | POA: Diagnosis not present

## 2019-07-18 DIAGNOSIS — Z1389 Encounter for screening for other disorder: Secondary | ICD-10-CM | POA: Diagnosis not present

## 2019-08-08 DIAGNOSIS — M8580 Other specified disorders of bone density and structure, unspecified site: Secondary | ICD-10-CM | POA: Diagnosis not present

## 2019-08-08 DIAGNOSIS — N183 Chronic kidney disease, stage 3 unspecified: Secondary | ICD-10-CM | POA: Diagnosis not present

## 2019-08-08 DIAGNOSIS — E782 Mixed hyperlipidemia: Secondary | ICD-10-CM | POA: Diagnosis not present

## 2019-08-08 DIAGNOSIS — J439 Emphysema, unspecified: Secondary | ICD-10-CM | POA: Diagnosis not present

## 2019-08-08 DIAGNOSIS — I10 Essential (primary) hypertension: Secondary | ICD-10-CM | POA: Diagnosis not present

## 2019-08-08 DIAGNOSIS — I480 Paroxysmal atrial fibrillation: Secondary | ICD-10-CM | POA: Diagnosis not present

## 2019-11-25 DIAGNOSIS — J439 Emphysema, unspecified: Secondary | ICD-10-CM | POA: Diagnosis not present

## 2019-11-25 DIAGNOSIS — I10 Essential (primary) hypertension: Secondary | ICD-10-CM | POA: Diagnosis not present

## 2019-11-25 DIAGNOSIS — M8580 Other specified disorders of bone density and structure, unspecified site: Secondary | ICD-10-CM | POA: Diagnosis not present

## 2019-11-25 DIAGNOSIS — I480 Paroxysmal atrial fibrillation: Secondary | ICD-10-CM | POA: Diagnosis not present

## 2019-11-25 DIAGNOSIS — E782 Mixed hyperlipidemia: Secondary | ICD-10-CM | POA: Diagnosis not present

## 2019-11-25 DIAGNOSIS — N183 Chronic kidney disease, stage 3 unspecified: Secondary | ICD-10-CM | POA: Diagnosis not present

## 2019-12-04 DIAGNOSIS — Z23 Encounter for immunization: Secondary | ICD-10-CM | POA: Diagnosis not present

## 2019-12-19 DIAGNOSIS — L97921 Non-pressure chronic ulcer of unspecified part of left lower leg limited to breakdown of skin: Secondary | ICD-10-CM | POA: Diagnosis not present

## 2019-12-30 DIAGNOSIS — L97921 Non-pressure chronic ulcer of unspecified part of left lower leg limited to breakdown of skin: Secondary | ICD-10-CM | POA: Diagnosis not present

## 2020-01-03 DIAGNOSIS — I83028 Varicose veins of left lower extremity with ulcer other part of lower leg: Secondary | ICD-10-CM | POA: Diagnosis not present

## 2020-01-03 DIAGNOSIS — I872 Venous insufficiency (chronic) (peripheral): Secondary | ICD-10-CM | POA: Diagnosis not present

## 2020-01-03 DIAGNOSIS — L97822 Non-pressure chronic ulcer of other part of left lower leg with fat layer exposed: Secondary | ICD-10-CM | POA: Diagnosis not present

## 2020-01-03 DIAGNOSIS — L97222 Non-pressure chronic ulcer of left calf with fat layer exposed: Secondary | ICD-10-CM | POA: Diagnosis not present

## 2020-01-10 DIAGNOSIS — I872 Venous insufficiency (chronic) (peripheral): Secondary | ICD-10-CM | POA: Diagnosis not present

## 2020-01-10 DIAGNOSIS — I83028 Varicose veins of left lower extremity with ulcer other part of lower leg: Secondary | ICD-10-CM | POA: Diagnosis not present

## 2020-01-10 DIAGNOSIS — L97222 Non-pressure chronic ulcer of left calf with fat layer exposed: Secondary | ICD-10-CM | POA: Diagnosis not present

## 2020-01-10 DIAGNOSIS — L97822 Non-pressure chronic ulcer of other part of left lower leg with fat layer exposed: Secondary | ICD-10-CM | POA: Diagnosis not present

## 2020-01-16 DIAGNOSIS — L97921 Non-pressure chronic ulcer of unspecified part of left lower leg limited to breakdown of skin: Secondary | ICD-10-CM | POA: Diagnosis not present

## 2020-01-16 DIAGNOSIS — R609 Edema, unspecified: Secondary | ICD-10-CM | POA: Diagnosis not present

## 2020-01-16 DIAGNOSIS — I10 Essential (primary) hypertension: Secondary | ICD-10-CM | POA: Diagnosis not present

## 2020-01-16 DIAGNOSIS — I700 Atherosclerosis of aorta: Secondary | ICD-10-CM | POA: Diagnosis not present

## 2020-01-16 DIAGNOSIS — N183 Chronic kidney disease, stage 3 unspecified: Secondary | ICD-10-CM | POA: Diagnosis not present

## 2020-01-16 DIAGNOSIS — I480 Paroxysmal atrial fibrillation: Secondary | ICD-10-CM | POA: Diagnosis not present

## 2020-01-16 DIAGNOSIS — E782 Mixed hyperlipidemia: Secondary | ICD-10-CM | POA: Diagnosis not present

## 2020-01-17 DIAGNOSIS — L97822 Non-pressure chronic ulcer of other part of left lower leg with fat layer exposed: Secondary | ICD-10-CM | POA: Diagnosis not present

## 2020-01-17 DIAGNOSIS — I83028 Varicose veins of left lower extremity with ulcer other part of lower leg: Secondary | ICD-10-CM | POA: Diagnosis not present

## 2020-01-21 DIAGNOSIS — M810 Age-related osteoporosis without current pathological fracture: Secondary | ICD-10-CM | POA: Diagnosis not present

## 2020-01-23 DIAGNOSIS — I83028 Varicose veins of left lower extremity with ulcer other part of lower leg: Secondary | ICD-10-CM | POA: Diagnosis not present

## 2020-01-23 DIAGNOSIS — I872 Venous insufficiency (chronic) (peripheral): Secondary | ICD-10-CM | POA: Diagnosis not present

## 2020-01-23 DIAGNOSIS — L97222 Non-pressure chronic ulcer of left calf with fat layer exposed: Secondary | ICD-10-CM | POA: Diagnosis not present

## 2020-01-23 DIAGNOSIS — L97822 Non-pressure chronic ulcer of other part of left lower leg with fat layer exposed: Secondary | ICD-10-CM | POA: Diagnosis not present

## 2020-01-30 DIAGNOSIS — I83028 Varicose veins of left lower extremity with ulcer other part of lower leg: Secondary | ICD-10-CM | POA: Diagnosis not present

## 2020-01-30 DIAGNOSIS — I872 Venous insufficiency (chronic) (peripheral): Secondary | ICD-10-CM | POA: Diagnosis not present

## 2020-01-30 DIAGNOSIS — L97822 Non-pressure chronic ulcer of other part of left lower leg with fat layer exposed: Secondary | ICD-10-CM | POA: Diagnosis not present

## 2020-02-06 DIAGNOSIS — L97822 Non-pressure chronic ulcer of other part of left lower leg with fat layer exposed: Secondary | ICD-10-CM | POA: Diagnosis not present

## 2020-02-06 DIAGNOSIS — L97222 Non-pressure chronic ulcer of left calf with fat layer exposed: Secondary | ICD-10-CM | POA: Diagnosis not present

## 2020-02-06 DIAGNOSIS — I83028 Varicose veins of left lower extremity with ulcer other part of lower leg: Secondary | ICD-10-CM | POA: Diagnosis not present

## 2020-02-06 DIAGNOSIS — I872 Venous insufficiency (chronic) (peripheral): Secondary | ICD-10-CM | POA: Diagnosis not present

## 2020-02-13 DIAGNOSIS — N390 Urinary tract infection, site not specified: Secondary | ICD-10-CM | POA: Diagnosis not present

## 2020-02-13 DIAGNOSIS — R350 Frequency of micturition: Secondary | ICD-10-CM | POA: Diagnosis not present

## 2020-02-13 DIAGNOSIS — R300 Dysuria: Secondary | ICD-10-CM | POA: Diagnosis not present

## 2020-02-13 DIAGNOSIS — L97222 Non-pressure chronic ulcer of left calf with fat layer exposed: Secondary | ICD-10-CM | POA: Diagnosis not present

## 2020-02-13 DIAGNOSIS — L905 Scar conditions and fibrosis of skin: Secondary | ICD-10-CM | POA: Diagnosis not present

## 2020-02-13 DIAGNOSIS — I872 Venous insufficiency (chronic) (peripheral): Secondary | ICD-10-CM | POA: Diagnosis not present

## 2020-02-28 DIAGNOSIS — N183 Chronic kidney disease, stage 3 unspecified: Secondary | ICD-10-CM | POA: Diagnosis not present

## 2020-02-28 DIAGNOSIS — J439 Emphysema, unspecified: Secondary | ICD-10-CM | POA: Diagnosis not present

## 2020-02-28 DIAGNOSIS — E782 Mixed hyperlipidemia: Secondary | ICD-10-CM | POA: Diagnosis not present

## 2020-02-28 DIAGNOSIS — I10 Essential (primary) hypertension: Secondary | ICD-10-CM | POA: Diagnosis not present

## 2020-02-28 DIAGNOSIS — M8580 Other specified disorders of bone density and structure, unspecified site: Secondary | ICD-10-CM | POA: Diagnosis not present

## 2020-02-28 DIAGNOSIS — I480 Paroxysmal atrial fibrillation: Secondary | ICD-10-CM | POA: Diagnosis not present

## 2020-04-06 DIAGNOSIS — I480 Paroxysmal atrial fibrillation: Secondary | ICD-10-CM | POA: Diagnosis not present

## 2020-04-06 DIAGNOSIS — E782 Mixed hyperlipidemia: Secondary | ICD-10-CM | POA: Diagnosis not present

## 2020-04-06 DIAGNOSIS — J439 Emphysema, unspecified: Secondary | ICD-10-CM | POA: Diagnosis not present

## 2020-04-06 DIAGNOSIS — M8580 Other specified disorders of bone density and structure, unspecified site: Secondary | ICD-10-CM | POA: Diagnosis not present

## 2020-04-06 DIAGNOSIS — I10 Essential (primary) hypertension: Secondary | ICD-10-CM | POA: Diagnosis not present

## 2020-04-06 DIAGNOSIS — N183 Chronic kidney disease, stage 3 unspecified: Secondary | ICD-10-CM | POA: Diagnosis not present

## 2020-05-08 DIAGNOSIS — E782 Mixed hyperlipidemia: Secondary | ICD-10-CM | POA: Diagnosis not present

## 2020-05-08 DIAGNOSIS — J439 Emphysema, unspecified: Secondary | ICD-10-CM | POA: Diagnosis not present

## 2020-05-08 DIAGNOSIS — I10 Essential (primary) hypertension: Secondary | ICD-10-CM | POA: Diagnosis not present

## 2020-05-08 DIAGNOSIS — M8580 Other specified disorders of bone density and structure, unspecified site: Secondary | ICD-10-CM | POA: Diagnosis not present

## 2020-05-08 DIAGNOSIS — I480 Paroxysmal atrial fibrillation: Secondary | ICD-10-CM | POA: Diagnosis not present

## 2020-05-08 DIAGNOSIS — N183 Chronic kidney disease, stage 3 unspecified: Secondary | ICD-10-CM | POA: Diagnosis not present

## 2020-06-01 NOTE — Progress Notes (Signed)
Cardiology Office Note   Date:  06/02/2020   ID:  Tracy [name], DOB [birth year], MRN [id number]  PCP:  Tracy Ada, MD    No chief complaint on file.  S/p MVR  Wt Readings from Last 3 Encounters:  06/02/20 153 lb 9.6 oz (69.7 kg)  05/29/19 152 lb 3.2 oz (69 kg)  05/02/18 145 lb (65.8 kg)       History of Present Illness: Tracy [name] is a 85 y.o. female   had MVR and Maze procedure in 8/10.No CAD at that time.  She had some persistent right leg swelling. SHe ultimately had some vein injection done. SHe wearscompression hose. It was thought to be due to some lymph nodes at the right hip per her report.  Husband was diagnosed with dementia, which was a source of stress. In 2019,shewas concerned about the longevity about her valve, especially with her husband's illness.He passed away in May 27, 2018 from [place].  Her daughter passed away in 2018/05/27 from liver cirrhosis. THis was stressful.   In 2018/05/27, She was social distancing.  She got her COVID vaccines.   She walks regularly.  Lives across the street from her son-in-law (husband of the daughter who passed away).  Denies : Chest pain. Dizziness. Leg edema. Nitroglycerin use. Orthopnea. Palpitations. Paroxysmal nocturnal dyspnea. Shortness of breath. Syncope.   Past Medical History:  Diagnosis Date  . Edema of leg    right leg  . Hyperlipidemia   . Hypertension   . Migraine headache   . Mitral valve prolapse   . Mitral valve regurgitation   . Osteopenia   . Persistent atrial fibrillation ([place])   . S/P mitral valve replacement    09/24/2008    Past Surgical History:  Procedure Laterality Date  . ABDOMINAL HYSTERECTOMY  1978   partial  . BLADDER SUSPENSION    . COX-MAZE MICROWAVE ABLATION  09/24/2008   complete biatrial lesion set  . ENDOVENOUS ABLATION SAPHENOUS VEIN W/ LASER  10-12-2010   right greater saphenous vein Elza Rafter MD  . MITRAL VALVE REPLACEMENT  0825/2010   66mm Medtronic  Mosaic bioprosthetic tissue valve     Current Outpatient Medications  Medication Sig Dispense Refill  . acetaminophen (TYLENOL) 500 MG tablet Take 2 tablets (1,000 mg total) by mouth every 6 (six) hours as needed. 30 tablet 0  . amoxicillin (AMOXIL) 500 MG capsule TAKE 4 CAPSULES (2000 MG) 1 HOUR PRIOR TO DENTAL PROCDURE 4 capsule 3  . aspirin 81 MG tablet Take 81 mg by mouth daily.      . Calcium 1200-1000 MG-UNIT CHEW Chew by mouth daily.    . cephALEXin (KEFLEX) 250 MG capsule Take 250 mg by mouth daily.    . furosemide (LASIX) 40 MG tablet Take 40 mg by mouth daily.     Marland Kitchen gabapentin (NEURONTIN) 600 MG tablet Take 600 mg by mouth 2 (two) times daily.      . metoprolol succinate (TOPROL-XL) 25 MG 24 hr tablet Take 25 mg by mouth daily.      . Multiple Vitamin (MULTIVITAMIN) tablet Take 1 tablet by mouth daily.      . potassium chloride SA (K-DUR,KLOR-CON) 20 MEQ tablet Take 20 mEq by mouth daily.      . rosuvastatin (CRESTOR) 10 MG tablet Take 10 mg by mouth daily.    . SUMAtriptan Succinate (IMITREX PO) Take 1 tablet by mouth daily as needed (MIGRAINES).     Marland Kitchen topiramate (TOPAMAX) 100 MG tablet Take  200 mg by mouth daily.     No current facility-administered medications for this visit.    Allergies:   Bacitracin-neomycin-polymyxin, Ciprofloxacin, Nitrofurantoin macrocrystal, Sulfamethoxazole-trimethoprim, Augmentin [amoxicillin-pot clavulanate], Monosodium glutamate, Sulfa antibiotics, and Neosporin [neomycin-bacitracin zn-polymyx]    Social History:  The patient  reports that she quit smoking about 24 years ago. Her smoking use included cigarettes. She has never used smokeless tobacco. She reports current alcohol use. She reports that she does not use drugs.   Family History:  The patient's family history includes Heart disease in her father; Stroke in her maternal grandmother.    ROS:  Please see the history of present illness.   Otherwise, review of systems are positive for  chronic right leg edema.   All other systems are reviewed and negative.    PHYSICAL EXAM: VS:  BP 132/84   Pulse 84   Ht 5\' 9"  (1.753 m)   Wt 153 lb 9.6 oz (69.7 kg)   SpO2 96%   BMI 22.68 kg/m  , BMI Body mass index is 22.68 kg/m. GEN: Well nourished, well developed, in no acute distress  HEENT: normal  Neck: no JVD, carotid bruits, or masses Cardiac: RRR; no murmurs, rubs, or gallops,; right leg edema  Respiratory:  clear to auscultation bilaterally, normal work of breathing GI: soft, nontender, nondistended, + BS MS: no deformity or atrophy  Skin: warm and dry, no rash Neuro:  Strength and sensation are intact Psych: euthymic mood, full affect   EKG:   The ekg ordered today demonstrates NSR, prolonged PR interval, PVCs   Recent Labs: No results found for requested labs within last 8760 hours.   Lipid Panel No results found for: CHOL, TRIG, HDL, CHOLHDL, VLDL, LDLCALC, LDLDIRECT   Other studies Reviewed: Additional studies/ records that were reviewed today with results demonstrating: LDL 84 in 2021.   ASSESSMENT AND PLAN:  1. S/p MVR: SBE prophylaxis needed. No CHF sx.  2. PAF: In NSR at this time.  3.  HyperlipidemiaWhole food, plant based diet. LDL controlled.  Need to check if she is still taking rosuvastatin.   4. Right leg edema: Elevate legs.   5. HTN: The current medical regimen is effective;  continue present plan and medications.    Current medicines are reviewed at length with the patient today.  The patient concerns regarding her medicines were addressed.  The following changes have been made:  No change  Labs/ tests ordered today include:   Orders Placed This Encounter  Procedures  . EKG 12-Lead    Recommend 150 minutes/week of aerobic exercise Low fat, low carb, high fiber diet recommended  Disposition:   FU in 1 year   Signed, Larae Grooms, MD  06/02/2020 4:30 PM    [facility] [place],  [place], [place]  [address] Phone: [phone]; Fax: [phone]

## 2020-06-02 ENCOUNTER — Ambulatory Visit (INDEPENDENT_AMBULATORY_CARE_PROVIDER_SITE_OTHER): Payer: Medicare Other | Admitting: Interventional Cardiology

## 2020-06-02 ENCOUNTER — Encounter: Payer: Self-pay | Admitting: Interventional Cardiology

## 2020-06-02 ENCOUNTER — Other Ambulatory Visit: Payer: Self-pay

## 2020-06-02 ENCOUNTER — Telehealth: Payer: Self-pay | Admitting: *Deleted

## 2020-06-02 VITALS — BP 132/84 | HR 84 | Ht 69.0 in | Wt 153.6 lb

## 2020-06-02 DIAGNOSIS — E782 Mixed hyperlipidemia: Secondary | ICD-10-CM | POA: Diagnosis not present

## 2020-06-02 DIAGNOSIS — Z952 Presence of prosthetic heart valve: Secondary | ICD-10-CM

## 2020-06-02 DIAGNOSIS — I10 Essential (primary) hypertension: Secondary | ICD-10-CM

## 2020-06-02 DIAGNOSIS — I480 Paroxysmal atrial fibrillation: Secondary | ICD-10-CM

## 2020-06-02 NOTE — Patient Instructions (Signed)
Medication Instructions:  Your physician recommends that you continue on your current medications as directed. Please refer to the Current Medication list given to you today.  *If you need a refill on your cardiac medications before your next appointment, please call your pharmacy*   Lab Work: none If you have labs (blood work) drawn today and your tests are completely normal, you will receive your results only by: . MyChart Message (if you have MyChart) OR . A paper copy in the mail If you have any lab test that is abnormal or we need to change your treatment, we will call you to review the results.   Testing/Procedures: none   Follow-Up: At [facility], you and your health needs are our priority.  As part of our continuing mission to provide you with exceptional heart care, we have created designated Provider Care Teams.  These Care Teams include your primary Cardiologist (physician) and Advanced Practice Providers (APPs -  Physician Assistants and Nurse Practitioners) who all work together to provide you with the care you need, when you need it.  We recommend signing up for the patient portal called "MyChart".  Sign up information is provided on this After Visit Summary.  MyChart is used to connect with patients for Virtual Visits (Telemedicine).  Patients are able to view lab/test results, encounter notes, upcoming appointments, etc.  Non-urgent messages can be sent to your provider as well.   To learn more about what you can do with MyChart, go to [email or web address].    Your next appointment:   12 month(s)  The format for your next appointment:   In Person  Provider:   You may see Jayadeep Varanasi, MD or one of the following Advanced Practice Providers on your designated Care Team:    Dayna Dunn, PA-C  Michele Lenze, PA-C    Other Instructions Your physician discussed the importance of taking an antibiotic prior to any dental, gastrointestinal, genitourinary  procedures to prevent damage to the heart valves from infection. You were given a prescription for an antibiotic based on current SBE prophylaxis guidelines.     

## 2020-06-02 NOTE — Telephone Encounter (Signed)
I called patient to see if she was taking Rosuvastatin. She confirms she is taking Rosuvastatin 10 mg daily.  She reports she has been having some muscle soreness, hip soreness and weak muscles.  She was going to discuss with Dr Tamala Julian at visit in a few weeks if this could be related to Rosuvastatin.  Reports she had leg pain which caused difficulty walking while taking Lipitor in the past

## 2020-06-03 NOTE — Telephone Encounter (Signed)
Sounds like her sx may be more age and arthritis related.

## 2020-06-04 NOTE — Telephone Encounter (Signed)
I spoke with patient and gave her message from Dr Irish Lack

## 2020-06-17 DIAGNOSIS — M8000XD Age-related osteoporosis with current pathological fracture, unspecified site, subsequent encounter for fracture with routine healing: Secondary | ICD-10-CM | POA: Diagnosis not present

## 2020-06-23 ENCOUNTER — Other Ambulatory Visit: Payer: Self-pay | Admitting: Interventional Cardiology

## 2020-06-23 NOTE — Telephone Encounter (Signed)
Pt's pharmacy is requesting a refill on amoxicillin for dental procedure. Would Dr. Irish Lack like to refill this medication? Please address

## 2020-06-23 NOTE — Telephone Encounter (Signed)
From 02/22/16 office note-- Refill amoxicillin.  She has been tolerating amoxicillin despite her allergy to Augmentin.  Refill sent to pharmacy

## 2020-06-24 DIAGNOSIS — M8000XD Age-related osteoporosis with current pathological fracture, unspecified site, subsequent encounter for fracture with routine healing: Secondary | ICD-10-CM | POA: Diagnosis not present

## 2020-06-24 DIAGNOSIS — I10 Essential (primary) hypertension: Secondary | ICD-10-CM | POA: Diagnosis not present

## 2020-06-30 DIAGNOSIS — G43009 Migraine without aura, not intractable, without status migrainosus: Secondary | ICD-10-CM | POA: Diagnosis not present

## 2020-07-15 DIAGNOSIS — Z1231 Encounter for screening mammogram for malignant neoplasm of breast: Secondary | ICD-10-CM | POA: Diagnosis not present

## 2020-07-23 DIAGNOSIS — M810 Age-related osteoporosis without current pathological fracture: Secondary | ICD-10-CM | POA: Diagnosis not present

## 2020-07-29 DIAGNOSIS — N183 Chronic kidney disease, stage 3 unspecified: Secondary | ICD-10-CM | POA: Diagnosis not present

## 2020-07-29 DIAGNOSIS — I700 Atherosclerosis of aorta: Secondary | ICD-10-CM | POA: Diagnosis not present

## 2020-07-29 DIAGNOSIS — Z1389 Encounter for screening for other disorder: Secondary | ICD-10-CM | POA: Diagnosis not present

## 2020-07-29 DIAGNOSIS — Z1159 Encounter for screening for other viral diseases: Secondary | ICD-10-CM | POA: Diagnosis not present

## 2020-07-29 DIAGNOSIS — R609 Edema, unspecified: Secondary | ICD-10-CM | POA: Diagnosis not present

## 2020-07-29 DIAGNOSIS — Z952 Presence of prosthetic heart valve: Secondary | ICD-10-CM | POA: Diagnosis not present

## 2020-07-29 DIAGNOSIS — G43909 Migraine, unspecified, not intractable, without status migrainosus: Secondary | ICD-10-CM | POA: Diagnosis not present

## 2020-07-29 DIAGNOSIS — Z0000 Encounter for general adult medical examination without abnormal findings: Secondary | ICD-10-CM | POA: Diagnosis not present

## 2020-07-29 DIAGNOSIS — E782 Mixed hyperlipidemia: Secondary | ICD-10-CM | POA: Diagnosis not present

## 2020-07-29 DIAGNOSIS — I10 Essential (primary) hypertension: Secondary | ICD-10-CM | POA: Diagnosis not present

## 2020-07-29 DIAGNOSIS — I480 Paroxysmal atrial fibrillation: Secondary | ICD-10-CM | POA: Diagnosis not present

## 2020-09-01 DIAGNOSIS — Z85828 Personal history of other malignant neoplasm of skin: Secondary | ICD-10-CM | POA: Diagnosis not present

## 2020-09-01 DIAGNOSIS — Z08 Encounter for follow-up examination after completed treatment for malignant neoplasm: Secondary | ICD-10-CM | POA: Diagnosis not present

## 2020-09-01 DIAGNOSIS — L570 Actinic keratosis: Secondary | ICD-10-CM | POA: Diagnosis not present

## 2020-09-01 DIAGNOSIS — L821 Other seborrheic keratosis: Secondary | ICD-10-CM | POA: Diagnosis not present

## 2020-09-03 DIAGNOSIS — J439 Emphysema, unspecified: Secondary | ICD-10-CM | POA: Diagnosis not present

## 2020-09-03 DIAGNOSIS — E782 Mixed hyperlipidemia: Secondary | ICD-10-CM | POA: Diagnosis not present

## 2020-09-03 DIAGNOSIS — M8580 Other specified disorders of bone density and structure, unspecified site: Secondary | ICD-10-CM | POA: Diagnosis not present

## 2020-09-03 DIAGNOSIS — I480 Paroxysmal atrial fibrillation: Secondary | ICD-10-CM | POA: Diagnosis not present

## 2020-09-03 DIAGNOSIS — I10 Essential (primary) hypertension: Secondary | ICD-10-CM | POA: Diagnosis not present

## 2020-09-03 DIAGNOSIS — N183 Chronic kidney disease, stage 3 unspecified: Secondary | ICD-10-CM | POA: Diagnosis not present
